# Patient Record
Sex: Male | Born: 1960 | Race: White | Hispanic: No | Marital: Married | Smoking: Never smoker
Health system: Southern US, Community
[De-identification: ages and names within clinical notes are randomized; demographics above are authoritative.]

## PROBLEM LIST (undated history)

## (undated) DIAGNOSIS — Z87442 Personal history of urinary calculi: Secondary | ICD-10-CM

## (undated) DIAGNOSIS — E663 Overweight: Secondary | ICD-10-CM

## (undated) DIAGNOSIS — M545 Low back pain: Secondary | ICD-10-CM

## (undated) DIAGNOSIS — T7840XA Allergy, unspecified, initial encounter: Secondary | ICD-10-CM

## (undated) DIAGNOSIS — K219 Gastro-esophageal reflux disease without esophagitis: Secondary | ICD-10-CM

## (undated) DIAGNOSIS — E7800 Pure hypercholesterolemia, unspecified: Secondary | ICD-10-CM

## (undated) DIAGNOSIS — J309 Allergic rhinitis, unspecified: Secondary | ICD-10-CM

## (undated) HISTORY — PX: OTHER SURGICAL HISTORY: SHX169

## (undated) HISTORY — DX: Pure hypercholesterolemia, unspecified: E78.00

## (undated) HISTORY — DX: Allergic rhinitis, unspecified: J30.9

## (undated) HISTORY — DX: Gastro-esophageal reflux disease without esophagitis: K21.9

## (undated) HISTORY — DX: Overweight: E66.3

## (undated) HISTORY — PX: TONSILLECTOMY: SUR1361

## (undated) HISTORY — DX: Low back pain: M54.5

## (undated) HISTORY — PX: COLONOSCOPY: SHX174

## (undated) HISTORY — DX: Allergy, unspecified, initial encounter: T78.40XA

## (undated) HISTORY — DX: Personal history of urinary calculi: Z87.442

---

## 1999-12-08 ENCOUNTER — Emergency Department (HOSPITAL_COMMUNITY): Admission: EM | Admit: 1999-12-08 | Discharge: 1999-12-08 | Payer: Self-pay

## 2001-10-09 ENCOUNTER — Ambulatory Visit (HOSPITAL_COMMUNITY): Admission: RE | Admit: 2001-10-09 | Discharge: 2001-10-09 | Payer: Self-pay | Admitting: Gastroenterology

## 2003-09-19 ENCOUNTER — Ambulatory Visit (HOSPITAL_COMMUNITY): Admission: RE | Admit: 2003-09-19 | Discharge: 2003-09-19 | Payer: Self-pay | Admitting: Urology

## 2003-10-20 ENCOUNTER — Ambulatory Visit (HOSPITAL_BASED_OUTPATIENT_CLINIC_OR_DEPARTMENT_OTHER): Admission: RE | Admit: 2003-10-20 | Discharge: 2003-10-20 | Payer: Self-pay | Admitting: Urology

## 2004-01-03 ENCOUNTER — Ambulatory Visit: Payer: Self-pay | Admitting: Internal Medicine

## 2004-03-19 ENCOUNTER — Ambulatory Visit: Payer: Self-pay | Admitting: Internal Medicine

## 2004-06-11 ENCOUNTER — Ambulatory Visit: Payer: Self-pay | Admitting: Internal Medicine

## 2004-07-02 ENCOUNTER — Ambulatory Visit: Payer: Self-pay | Admitting: Internal Medicine

## 2005-04-09 ENCOUNTER — Ambulatory Visit: Payer: Self-pay | Admitting: Internal Medicine

## 2005-04-12 ENCOUNTER — Ambulatory Visit: Payer: Self-pay | Admitting: Internal Medicine

## 2005-07-04 ENCOUNTER — Ambulatory Visit: Payer: Self-pay | Admitting: Internal Medicine

## 2005-07-10 ENCOUNTER — Ambulatory Visit: Payer: Self-pay | Admitting: Internal Medicine

## 2006-07-22 ENCOUNTER — Ambulatory Visit: Payer: Self-pay | Admitting: Internal Medicine

## 2006-07-22 LAB — CONVERTED CEMR LAB
ALT: 28 U/L
AST: 20 U/L
Albumin: 4 g/dL
Alkaline Phosphatase: 54 U/L
BUN: 24 mg/dL — ABNORMAL HIGH
Basophils Absolute: 0 K/uL
Basophils Relative: 1 %
Bilirubin Urine: NEGATIVE
Bilirubin, Direct: 0.1 mg/dL
CO2: 31 meq/L
Calcium: 9.2 mg/dL
Chloride: 105 meq/L
Cholesterol: 212 mg/dL
Creatinine, Ser: 1.1 mg/dL
Direct LDL: 135.2 mg/dL
Eosinophils Absolute: 0.2 K/uL
Eosinophils Relative: 4.5 %
GFR calc Af Amer: 92 mL/min
GFR calc non Af Amer: 76 mL/min
Glucose, Bld: 94 mg/dL
HCT: 38.5 % — ABNORMAL LOW
HDL: 30.7 mg/dL — ABNORMAL LOW
Hemoglobin, Urine: NEGATIVE
Hemoglobin: 13.3 g/dL
Ketones, ur: NEGATIVE mg/dL
Leukocytes, UA: NEGATIVE
Lymphocytes Relative: 30.9 %
MCHC: 34.5 g/dL
MCV: 88.7 fL
Monocytes Absolute: 0.3 K/uL
Monocytes Relative: 8.9 %
Neutro Abs: 2 K/uL
Neutrophils Relative %: 54.7 %
Nitrite: NEGATIVE
PSA: 1.59 ng/mL
Platelets: 145 K/uL — ABNORMAL LOW
Potassium: 4.3 meq/L
RBC: 4.34 M/uL
RDW: 12.1 %
Sodium: 140 meq/L
Specific Gravity, Urine: 1.025
TSH: 1.07 u[IU]/mL
Total Bilirubin: 0.9 mg/dL
Total CHOL/HDL Ratio: 6.9
Total Protein, Urine: NEGATIVE mg/dL
Total Protein: 6.7 g/dL
Triglycerides: 114 mg/dL
Urine Glucose: NEGATIVE mg/dL
Urobilinogen, UA: 0.2
VLDL: 23 mg/dL
WBC: 3.6 10*3/microliter — ABNORMAL LOW
pH: 6

## 2006-07-30 ENCOUNTER — Ambulatory Visit: Payer: Self-pay | Admitting: Internal Medicine

## 2006-11-26 ENCOUNTER — Encounter: Payer: Self-pay | Admitting: *Deleted

## 2006-11-26 DIAGNOSIS — T7840XA Allergy, unspecified, initial encounter: Secondary | ICD-10-CM

## 2006-11-26 DIAGNOSIS — M545 Low back pain, unspecified: Secondary | ICD-10-CM

## 2006-11-26 DIAGNOSIS — E7800 Pure hypercholesterolemia, unspecified: Secondary | ICD-10-CM

## 2006-11-26 DIAGNOSIS — E663 Overweight: Secondary | ICD-10-CM | POA: Insufficient documentation

## 2006-11-26 HISTORY — DX: Allergy, unspecified, initial encounter: T78.40XA

## 2006-11-26 HISTORY — DX: Overweight: E66.3

## 2006-11-26 HISTORY — DX: Low back pain, unspecified: M54.50

## 2006-11-26 HISTORY — DX: Pure hypercholesterolemia, unspecified: E78.00

## 2006-12-31 ENCOUNTER — Encounter: Payer: Self-pay | Admitting: Internal Medicine

## 2007-09-16 ENCOUNTER — Ambulatory Visit: Payer: Self-pay | Admitting: Internal Medicine

## 2007-09-16 LAB — CONVERTED CEMR LAB
ALT: 22 units/L (ref 0–53)
AST: 15 units/L (ref 0–37)
BUN: 24 mg/dL — ABNORMAL HIGH (ref 6–23)
Bilirubin Urine: NEGATIVE
Chloride: 104 meq/L (ref 96–112)
Eosinophils Relative: 2.2 % (ref 0.0–5.0)
Glucose, Bld: 91 mg/dL (ref 70–99)
HCT: 45.6 % (ref 39.0–52.0)
HDL: 30.9 mg/dL — ABNORMAL LOW (ref >39.0)
Hemoglobin, Urine: NEGATIVE
Ketones, ur: NEGATIVE mg/dL
Leukocytes, UA: NEGATIVE
Lymphocytes Relative: 27.2 % (ref 12.0–46.0)
MCV: 89.9 fL (ref 78.0–100.0)
Neutro Abs: 2.8 10*3/uL (ref 1.4–7.7)
Neutrophils Relative %: 61.5 % (ref 43.0–77.0)
Nitrite: NEGATIVE
Platelets: 147 10*3/uL — ABNORMAL LOW (ref 150–400)
Potassium: 3.9 meq/L (ref 3.5–5.1)
Sodium: 140 meq/L (ref 135–145)
TSH: 1.71 microintl units/mL (ref 0.35–5.50)
Total CHOL/HDL Ratio: 6
Total Protein, Urine: NEGATIVE mg/dL

## 2007-09-21 ENCOUNTER — Ambulatory Visit: Payer: Self-pay | Admitting: Internal Medicine

## 2007-09-21 DIAGNOSIS — K219 Gastro-esophageal reflux disease without esophagitis: Secondary | ICD-10-CM

## 2007-09-21 DIAGNOSIS — Z87442 Personal history of urinary calculi: Secondary | ICD-10-CM | POA: Insufficient documentation

## 2007-09-21 DIAGNOSIS — J309 Allergic rhinitis, unspecified: Secondary | ICD-10-CM | POA: Insufficient documentation

## 2007-09-21 HISTORY — DX: Gastro-esophageal reflux disease without esophagitis: K21.9

## 2007-09-21 HISTORY — DX: Personal history of urinary calculi: Z87.442

## 2007-09-21 HISTORY — DX: Allergic rhinitis, unspecified: J30.9

## 2008-10-12 ENCOUNTER — Ambulatory Visit: Payer: Self-pay | Admitting: Internal Medicine

## 2008-10-12 LAB — CONVERTED CEMR LAB
ALT: 23 units/L (ref 0–53)
Basophils Relative: 0.5 % (ref 0.0–3.0)
CO2: 33 meq/L — ABNORMAL HIGH (ref 19–32)
Calcium: 9.3 mg/dL (ref 8.4–10.5)
Chloride: 102 meq/L (ref 96–112)
Cholesterol: 204 mg/dL — ABNORMAL HIGH (ref 0–200)
Eosinophils Absolute: 0.2 10*3/uL (ref 0.0–0.7)
Eosinophils Relative: 3.1 % (ref 0.0–5.0)
HCT: 48.8 % (ref 39.0–52.0)
HDL: 32.7 mg/dL — ABNORMAL LOW (ref >39.00)
Hemoglobin: 16.9 g/dL (ref 13.0–17.0)
Lymphocytes Relative: 28.6 % (ref 12.0–46.0)
MCHC: 34.6 g/dL (ref 30.0–36.0)
MCV: 90.9 fL (ref 78.0–100.0)
Nitrite: NEGATIVE
PSA: 1.88 ng/mL (ref 0.10–4.00)
Platelets: 150 10*3/uL (ref 150.0–400.0)
Potassium: 4.5 meq/L (ref 3.5–5.1)
RBC: 5.37 M/uL (ref 4.22–5.81)
TSH: 2.3 microintl units/mL (ref 0.35–5.50)
Total CHOL/HDL Ratio: 6
Total Protein, Urine: NEGATIVE mg/dL
Total Protein: 7.1 g/dL (ref 6.0–8.3)
Triglycerides: 142 mg/dL (ref 0.0–149.0)
Urobilinogen, UA: 0.2 (ref 0.0–1.0)
VLDL: 28.4 mg/dL (ref 0.0–40.0)
pH: 7 (ref 5.0–8.0)

## 2008-10-17 ENCOUNTER — Ambulatory Visit: Payer: Self-pay | Admitting: Internal Medicine

## 2009-10-12 ENCOUNTER — Ambulatory Visit: Payer: Self-pay | Admitting: Internal Medicine

## 2009-10-12 LAB — CONVERTED CEMR LAB
ALT: 26 units/L (ref 0–53)
AST: 19 units/L (ref 0–37)
Alkaline Phosphatase: 50 units/L (ref 39–117)
BUN: 21 mg/dL (ref 6–23)
Bilirubin Urine: NEGATIVE
Bilirubin, Direct: 0.2 mg/dL (ref 0.0–0.3)
CO2: 30 meq/L (ref 19–32)
Calcium: 9.2 mg/dL (ref 8.4–10.5)
Chloride: 105 meq/L (ref 96–112)
Creatinine, Ser: 1.1 mg/dL (ref 0.4–1.5)
Direct LDL: 150.3 mg/dL
Eosinophils Relative: 1.7 % (ref 0.0–5.0)
Glucose, Bld: 80 mg/dL (ref 70–99)
HDL: 34.9 mg/dL — ABNORMAL LOW (ref >39.00)
Hemoglobin, Urine: NEGATIVE
Leukocytes, UA: NEGATIVE
Lymphs Abs: 1.3 10*3/uL (ref 0.7–4.0)
MCHC: 34.8 g/dL (ref 30.0–36.0)
Monocytes Absolute: 0.4 10*3/uL (ref 0.1–1.0)
Neutro Abs: 3 10*3/uL (ref 1.4–7.7)
Neutrophils Relative %: 61.7 % (ref 43.0–77.0)
PSA: 1.68 ng/mL (ref 0.10–4.00)
Platelets: 159 10*3/uL (ref 150.0–400.0)
Potassium: 4.5 meq/L (ref 3.5–5.1)
RDW: 13 % (ref 11.5–14.6)
Sodium: 142 meq/L (ref 135–145)
Specific Gravity, Urine: 1.025 (ref 1.000–1.030)
Total Bilirubin: 1 mg/dL (ref 0.3–1.2)
Urobilinogen, UA: 0.2 (ref 0.0–1.0)
pH: 6.5 (ref 5.0–8.0)

## 2009-10-18 ENCOUNTER — Ambulatory Visit: Payer: Self-pay | Admitting: Internal Medicine

## 2009-10-18 ENCOUNTER — Encounter: Payer: Self-pay | Admitting: Internal Medicine

## 2009-11-28 ENCOUNTER — Ambulatory Visit: Payer: Self-pay | Admitting: Internal Medicine

## 2009-11-28 LAB — CONVERTED CEMR LAB
ALT: 27 units/L (ref 0–53)
AST: 17 units/L (ref 0–37)
LDL Cholesterol: 106 mg/dL — ABNORMAL HIGH (ref 0–99)
Total Bilirubin: 1 mg/dL (ref 0.3–1.2)
Total Protein: 6.5 g/dL (ref 6.0–8.3)
VLDL: 21 mg/dL (ref 0.0–40.0)

## 2010-03-18 LAB — CONVERTED CEMR LAB: PSA: 1.68 ng/mL (ref 0.10–4.00)

## 2010-03-20 NOTE — Assessment & Plan Note (Signed)
Summary: CPX    Vital Signs:  Patient profile:   50 year old male Height:      69.5 inches Weight:      190 pounds BMI:     27.76 O2 Sat:      97 % on Room air Temp:     97.3 degrees F oral Pulse rate:   75 / minute BP sitting:   110 / 80  (left arm) Cuff size:   regular  Vitals Entered By: Zella Ball Ewing CMA Duncan Dull) (October 18, 2009 8:27 AM)  O2 Flow:  Room air  CC: Adult Physical/RE   CC:  Adult Physical/RE.  History of Present Illness: unfort gained 10 lbs with dietary indiscretion relate dto work but plans to do better; trying to folow lower chol diet;  Pt denies CP, worsening sob, doe, wheezing, orthopnea, pnd, worsening LE edema, palps, dizziness or syncope  Pt denies new neuro symptoms such as headache, facial or extremity weakness  No fever, wt loss, night sweats, loss of appetite or other constitutional symptoms  Overall good complaince with meds, and good tolerability.    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 1 By Mouth Once Daily 2)  Adult Aspirin Ec Low Strength 81 Mg  Tbec (Aspirin) .[name].. 1 By Mouth Once Daily  Current Medications (verified): 1)  Pravachol 40 Mg  Tabs (Pravastatin Sodium) .[name].. 1 By Mouth Once Daily 2)  Adult Aspirin Ec Low Strength 81 Mg  Tbec (Aspirin) .[name].. 1 By Mouth Once Daily  Allergies (verified): No Known Drug Allergies  Past History:  Past Medical History: Last updated: 09/21/2007 BACK PAIN, LUMBAR, CHRONIC (ICD-724.2) s/p ESI ALLERGY (ICD-995.3) OBESITY,  MILD (ICD-278.02) HYPERCHOLESTEROLEMIA (ICD-272.0) Nephrolithiasis, hx of  Allergic rhinitis GERD E.D.  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         all otherwise negative per pt -    Physical Exam  General:  alert and overweight-appearing.   - mild Head:  normocephalic and atraumatic.   Eyes:  vision grossly intact, pupils equal, and pupils round.   Ears:  R ear normal and L ear normal.   Nose:  no  external deformity and no nasal discharge.   Mouth:  no gingival abnormalities and pharynx pink and moist.   Neck:  supple and no masses.   Lungs:  normal respiratory effort and normal breath sounds.   Heart:  normal rate and regular rhythm.   Abdomen:  soft, non-tender, and normal bowel sounds.   Msk:  no joint tenderness and no joint swelling.   Extremities:  no edema, no erythema  Neurologic:  cranial nerves II-XII intact and  strength normal in all extremities.     Impression & Recommendations:  Problem # 1:  Preventive Health Care (ICD-V70.0) Overall doing well, age appropriate education and counseling updated and referral for appropriate preventive services done unless declined, immunizations up to date or declined, diet counseling done if overweight, urged to quit smoking if smokes , most recent labs reviewed and current ordered if appropriate, ecg reviewed or declined (interpretation per ECG scanned in the EMR if done); information regarding Medicare Prevention requirements given if appropriate; speciality referrals updated as appropriate   Complete Medication List: 1)  Pravachol 40 Mg Tabs (Pravastatin sodium) .[name].. 1 by mouth once daily 2)  Adult Aspirin Ec Low Strength 81 Mg Tbec (Aspirin) .[name].. 1 by mouth once daily  Patient Instructions: 1)  please re-start the cholesterol medication 2)  please return for LAB only in 4 wks: 3)  Hepatic Panel prior to visit, ICD-9: v85.69 4)  Lipid Panel prior to visit, ICD-9: 272.0 5)  Continue all previous medications as before this visit  6)  Please schedule a follow-up appointment in 1 year or sooner if needed Prescriptions: PRAVACHOL 40 MG  TABS (PRAVASTATIN SODIUM) 1 by mouth once daily  #90 x 3   Entered and Authorized by:   Corwin Levins MD   Signed by:   Corwin Levins MD on 10/18/2009   Method used:   Print then Give to Patient   RxID:   [id number]

## 2010-07-06 NOTE — Op Note (Signed)
NAME:  Tyler Murphy, Tyler Murphy                          ACCOUNT NO.:  [id number]   MEDICAL RECORD NO.:  [id number]                   PATIENT TYPE:  AMB   LOCATION:  [facility]                                 FACILITY:  [facility]   PHYSICIAN:  Excell Seltzer. Annabell Howells, M.D.                 DATE OF BIRTH:  [birth year]   DATE OF PROCEDURE:  10/20/2003  DATE OF DISCHARGE:                                 OPERATIVE REPORT   PROCEDURE:  Left ureteroscopic stone extraction and insertion of a left  double-J stent.   POSTOPERATIVE DIAGNOSIS:  Left distal ureteral stone.   POSTOPERATIVE DIAGNOSIS:  Left distal ureteral stone.   SURGEON:  Excell Seltzer. Annabell Howells, M.D.   ANESTHESIA:  General.   SPECIMENS:  Stone.   DRAINS:  A 6 French x 26 cm double-J stent.   COMPLICATIONS:  None.   INDICATIONS:  Mr. Abril is a 50 year old white male who recently underwent  lithotripsy for a left distal ureteral stone.  The stone did not appear to  be impacted by lithotripsy, and he now returns for ureteroscopic stone  extraction.   FINDINGS/PROCEDURE:  Patient was given p.o. antibiotics.  He was taken to  the operating room where a general anesthetic was induced.  He was placed in  the lithotomy position.  His perineum and genitalia were prepped with  Betadine solution.  He was draped in the usual sterile fashion.  A 6 French  short ureteroscope was passed per urethra.  This revealed a normal urethra,  a short, nonobstructing prostate, and an unremarkable bladder.  The left  ureteral orifice was cannulated with a  guidewire.  The stone was noted to  move with the passage of the wire; however, I was unable to get the  ureteroscope over the wire and up the ureteral orifice.  The ureteroscope  was removed, and a 4 cm 15 French balloon dilation catheter was passed over  the wire, up the ureteral meatus where the balloon was dilated.  There was a  waist at the level of the meatus which dissipated with 14 atmospheres of  pressure.  The balloon  was then removed.  The ureteroscope was then inserted  alongside the wire.  The stone was visualized and grasped with a Nitinol  basket and removed without difficulty.  Once the stone was removed, a 6  French 26 cm double-J stent was inserted over the wire to the kidney and  fluoroscopic guidance.  Once in position, the wire was removed, leaving good  coil in the kidney and good coil in the bladder.  The stent was taped to his  penis.  He was taken down from the lithotomy position.  Anesthetic was  reversed.  He was moved to the recovery room in stable condition.  There  were no complications.  Excell Seltzer. Annabell Howells, M.D.   JJW/MEDQ  D:  10/20/2003  T:  10/20/2003  Job:  [id number]

## 2010-10-25 ENCOUNTER — Telehealth: Payer: Self-pay

## 2010-10-25 DIAGNOSIS — Z0389 Encounter for observation for other suspected diseases and conditions ruled out: Secondary | ICD-10-CM

## 2010-10-25 DIAGNOSIS — Z0000 Encounter for general adult medical examination without abnormal findings: Secondary | ICD-10-CM

## 2010-10-25 NOTE — Telephone Encounter (Signed)
Put order in for lab work,

## 2010-10-26 ENCOUNTER — Other Ambulatory Visit (INDEPENDENT_AMBULATORY_CARE_PROVIDER_SITE_OTHER): Payer: BC Managed Care – PPO

## 2010-10-26 ENCOUNTER — Encounter: Payer: Self-pay | Admitting: Internal Medicine

## 2010-10-26 ENCOUNTER — Other Ambulatory Visit: Payer: Self-pay

## 2010-10-26 DIAGNOSIS — Z0000 Encounter for general adult medical examination without abnormal findings: Secondary | ICD-10-CM | POA: Insufficient documentation

## 2010-10-26 DIAGNOSIS — Z0389 Encounter for observation for other suspected diseases and conditions ruled out: Secondary | ICD-10-CM

## 2010-10-26 DIAGNOSIS — Z0001 Encounter for general adult medical examination with abnormal findings: Secondary | ICD-10-CM | POA: Insufficient documentation

## 2010-10-26 LAB — HEPATIC FUNCTION PANEL
AST: 18 U/L (ref 0–37)
Bilirubin, Direct: 0.1 mg/dL (ref 0.0–0.3)
Total Protein: 6.6 g/dL (ref 6.0–8.3)

## 2010-10-26 LAB — CBC WITH DIFFERENTIAL/PLATELET
Basophils Relative: 0.5 % (ref 0.0–3.0)
Eosinophils Relative: 2 % (ref 0.0–5.0)
Lymphocytes Relative: 31.9 % (ref 12.0–46.0)
Lymphs Abs: 1.5 10*3/uL (ref 0.7–4.0)
MCHC: 34.1 g/dL (ref 30.0–36.0)
MCV: 90.3 fl (ref 78.0–100.0)
Monocytes Absolute: 0.4 10*3/uL (ref 0.1–1.0)
Neutro Abs: 2.8 10*3/uL (ref 1.4–7.7)
Neutrophils Relative %: 57.4 % (ref 43.0–77.0)
Platelets: 165 10*3/uL (ref 150.0–400.0)
RDW: 13.1 % (ref 11.5–14.6)
WBC: 4.8 10*3/uL (ref 4.5–10.5)

## 2010-10-26 LAB — URINALYSIS, ROUTINE W REFLEX MICROSCOPIC
Ketones, ur: NEGATIVE
Nitrite: NEGATIVE
Specific Gravity, Urine: 1.025 (ref 1.000–1.030)
Urine Glucose: NEGATIVE
pH: 5.5 (ref 5.0–8.0)

## 2010-10-26 LAB — LIPID PANEL
Cholesterol: 199 mg/dL (ref 0–200)
LDL Cholesterol: 146 mg/dL — ABNORMAL HIGH (ref 0–99)
Total CHOL/HDL Ratio: 5
Triglycerides: 74 mg/dL (ref 0.0–149.0)

## 2010-10-26 LAB — BASIC METABOLIC PANEL
CO2: 27 mEq/L (ref 19–32)
Calcium: 9.1 mg/dL (ref 8.4–10.5)

## 2010-10-26 LAB — TSH: TSH: 1.84 u[IU]/mL (ref 0.35–5.50)

## 2010-11-01 ENCOUNTER — Encounter: Payer: Self-pay | Admitting: Internal Medicine

## 2010-11-01 ENCOUNTER — Ambulatory Visit (INDEPENDENT_AMBULATORY_CARE_PROVIDER_SITE_OTHER): Payer: BC Managed Care – PPO | Admitting: Internal Medicine

## 2010-11-01 VITALS — BP 112/78 | HR 83 | Temp 98.6°F | Ht 70.0 in | Wt 186.0 lb

## 2010-11-01 DIAGNOSIS — E7800 Pure hypercholesterolemia, unspecified: Secondary | ICD-10-CM

## 2010-11-01 DIAGNOSIS — Z79899 Other long term (current) drug therapy: Secondary | ICD-10-CM

## 2010-11-01 DIAGNOSIS — Z0000 Encounter for general adult medical examination without abnormal findings: Secondary | ICD-10-CM

## 2010-11-01 DIAGNOSIS — Z23 Encounter for immunization: Secondary | ICD-10-CM

## 2010-11-01 MED ORDER — ATORVASTATIN CALCIUM 20 MG PO TABS: 20.0000 mg | ORAL_TABLET | Freq: Every day | ORAL | Status: DC

## 2010-11-01 NOTE — Patient Instructions (Signed)
Please re-start the Aspirin 81 mg at one per day Start the lipitor generic at 20 mg per day Please return in 4 wks for followup lab tests :  Lipids, and liver tests Please call the phone number [phone] (the PhoneTree System) for results of testing in 2-3 days;  When calling, simply dial the number, and when prompted enter the MRN number above (the Medical Record Number) and the # key, then the message should start. You had the flu shot today Please return in 1 year for your yearly visit, or sooner if needed, with Lab testing done 3-5 days before

## 2010-11-01 NOTE — Assessment & Plan Note (Signed)
For f/u lft on new statin in 4 wks

## 2010-11-01 NOTE — Assessment & Plan Note (Signed)
Uncontrolled, for lipitor 20 , f/u lab in 4 wks,  Lab Results  Component Value Date   LDLCALC 146* 10/26/2010

## 2010-11-01 NOTE — Progress Notes (Signed)
Subjective:    Patient ID: Tyler Murphy, male    DOB: December 08, 1960, 50 y.o.   MRN: [id number]  HPI Here for wellness and f/u;  Overall doing ok;  Pt denies CP, worsening SOB, DOE, wheezing, orthopnea, PND, worsening LE edema, palpitations, dizziness or syncope.  Pt denies neurological change such as new Headache, facial or extremity weakness.  Pt denies polydipsia, polyuria, or low sugar symptoms. Pt states overall good compliance with treatment and medications, good tolerability, and trying to follow lower cholesterol diet.  Pt denies worsening depressive symptoms, suicidal ideation or panic. No fever, wt loss, night sweats, loss of appetite, or other constitutional symptoms.  Pt states good ability with ADL's, low fall risk, home safety reviewed and adequate, no significant changes in hearing or vision, and occasionally active with exercise. No acute complaints Past Medical History  Diagnosis Date  . ALLERGIC RHINITIS 09/21/2007  . ALLERGY 11/26/2006  . BACK PAIN, LUMBAR, CHRONIC 11/26/2006  . GERD 09/21/2007  . HYPERCHOLESTEROLEMIA 11/26/2006  . 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 OBESITY, MILD 11/26/2006   Past Surgical History  Procedure Date  . Tonsillectomy   . S/p lithotrypsy     reports that he has never smoked. He does not have any smokeless tobacco history on file. He reports that he does not drink alcohol. His drug history not on file. family history is not on file.  He is adopted. No Known Allergies Current Outpatient Prescriptions on File Prior to Visit  Medication Sig Dispense Refill  . aspirin 81 MG tablet Take 81 mg by mouth daily.         Review of Systems Review of Systems  Constitutional: Negative for diaphoresis, activity change, appetite change and unexpected weight change.  HENT: Negative for hearing loss, ear pain, facial swelling, mouth sores and neck stiffness.   Eyes: Negative for pain, redness and visual disturbance.  Respiratory: Negative for shortness of breath  and wheezing.   Cardiovascular: Negative for chest pain and palpitations.  Gastrointestinal: Negative for diarrhea, blood in stool, abdominal distention and rectal pain.  Genitourinary: Negative for hematuria, flank pain and decreased urine volume.  Musculoskeletal: Negative for myalgias and joint swelling.  Skin: Negative for color change and wound.  Neurological: Negative for syncope and numbness.  Hematological: Negative for adenopathy.  Psychiatric/Behavioral: Negative for hallucinations, self-injury, decreased concentration and agitation.      Objective:   Physical Exam BP 112/78  Pulse 83  Temp(Src) 98.6 F (37 C) (Oral)  Ht 5\' 10"  (1.778 m)  Wt 186 lb (84.369 kg)  BMI 26.69 kg/m2  SpO2 96% Physical Exam  VS noted Constitutional: Pt is oriented to person, place, and time. Appears well-developed and well-nourished.  HENT:  Head: Normocephalic and atraumatic.  Right Ear: External ear normal.  Left Ear: External ear normal.  Nose: Nose normal.  Mouth/Throat: Oropharynx is clear and moist.  Eyes: Conjunctivae and EOM are normal. Pupils are equal, round, and reactive to light.  Neck: Normal range of motion. Neck supple. No JVD present. No tracheal deviation present.  Cardiovascular: Normal rate, regular rhythm, normal heart sounds and intact distal pulses.   Pulmonary/Chest: Effort normal and breath sounds normal.  Abdominal: Soft. Bowel sounds are normal. There is no tenderness.  Musculoskeletal: Normal range of motion. Exhibits no edema.  Lymphadenopathy:  Has no cervical adenopathy.  Neurological: Pt is alert and oriented to person, place, and time. Pt has normal reflexes. No cranial nerve deficit.  Skin: Skin is warm and dry. No  rash noted.  Psychiatric:  Has  normal mood and affect. Behavior is normal.         Assessment & Plan:

## 2010-11-30 ENCOUNTER — Other Ambulatory Visit (INDEPENDENT_AMBULATORY_CARE_PROVIDER_SITE_OTHER): Payer: BC Managed Care – PPO

## 2010-11-30 DIAGNOSIS — Z79899 Other long term (current) drug therapy: Secondary | ICD-10-CM

## 2010-11-30 DIAGNOSIS — E7800 Pure hypercholesterolemia, unspecified: Secondary | ICD-10-CM

## 2010-11-30 LAB — LIPID PANEL
Cholesterol: 118 mg/dL (ref 0–200)
HDL: 39.4 mg/dL (ref >39.00)
LDL Cholesterol: 67 mg/dL (ref 0–99)
Total CHOL/HDL Ratio: 3
Triglycerides: 57 mg/dL (ref 0.0–149.0)
VLDL: 11.4 mg/dL (ref 0.0–40.0)

## 2010-11-30 LAB — HEPATIC FUNCTION PANEL
AST: 19 U/L (ref 0–37)
Total Bilirubin: 0.8 mg/dL (ref 0.3–1.2)

## 2010-12-01 NOTE — Progress Notes (Signed)
Quick Note:  Voice message left on PhoneTree system - lab is negative, normal or otherwise stable, pt to continue same tx ______ 

## 2011-10-26 ENCOUNTER — Ambulatory Visit (INDEPENDENT_AMBULATORY_CARE_PROVIDER_SITE_OTHER): Payer: BC Managed Care – PPO | Admitting: Family Medicine

## 2011-10-26 ENCOUNTER — Encounter: Payer: Self-pay | Admitting: Family Medicine

## 2011-10-26 VITALS — BP 116/80 | HR 65 | Temp 97.6°F | Resp 16 | Ht 70.0 in | Wt 186.0 lb

## 2011-10-26 DIAGNOSIS — M79609 Pain in unspecified limb: Secondary | ICD-10-CM

## 2011-10-26 DIAGNOSIS — S61209A Unspecified open wound of unspecified finger without damage to nail, initial encounter: Secondary | ICD-10-CM

## 2011-10-26 NOTE — Patient Instructions (Signed)
WOUND CARE Please return in 10 days to have your stitches/staples removed or sooner if you have concerns. [name] Keep area clean and dry for 24 hours. Do not remove bandage, if applied. . After 24 hours, remove bandage and wash wound gently with mild soap and warm water. Reapply a new bandage after cleaning wound, if directed. . Continue daily cleansing with soap and water until stitches/staples are removed. . Do not apply any ointments or creams to the wound while stitches/staples are in place, as this may cause delayed healing. . Notify the office if you experience any of the following signs of infection: Swelling, redness, pus drainage, streaking, fever >101.0 F . Notify the office if you experience excessive bleeding that does not stop after 15-20 minutes of constant, firm Pressure.   Tetanus vaccine given today.  Recommend a TDAP in about 1- 2 years which will update the Pertussis also.

## 2011-10-26 NOTE — Progress Notes (Signed)
Subjective: Patient was doing some trimming this morning using clippers rather than making nor use with something powered like his trimmers. He cut his left index finger. He came on over here.  Objective: Last tetanus shot was in 05. Wound on the radial aspect of left index finger less than 2 cm long. Sensory is grossly intact. Flexion and extension is firm.  Assessment: Wound index finger  Plan: Tetanus Sutures

## 2011-10-26 NOTE — Progress Notes (Signed)
Verbal consent obtained from the patient.  Local anesthesia with 4cc Lidocaine 2% without epinephrine.  Wound scrubbed with soap and water and rinsed.  Wound closed with #5 5-0 Prolene simple interrupted sutures.  Wound cleansed and dressed.  

## 2011-11-01 ENCOUNTER — Other Ambulatory Visit (INDEPENDENT_AMBULATORY_CARE_PROVIDER_SITE_OTHER): Payer: BC Managed Care – PPO

## 2011-11-01 DIAGNOSIS — Z0000 Encounter for general adult medical examination without abnormal findings: Secondary | ICD-10-CM

## 2011-11-01 LAB — URINALYSIS, ROUTINE W REFLEX MICROSCOPIC
Leukocytes, UA: NEGATIVE
Specific Gravity, Urine: 1.025 (ref 1.000–1.030)
Total Protein, Urine: NEGATIVE
Urine Glucose: NEGATIVE

## 2011-11-01 LAB — CBC WITH DIFFERENTIAL/PLATELET
Basophils Absolute: 0 10*3/uL (ref 0.0–0.1)
Basophils Relative: 0.4 % (ref 0.0–3.0)
Eosinophils Absolute: 0.1 10*3/uL (ref 0.0–0.7)
Lymphocytes Relative: 25.5 % (ref 12.0–46.0)
MCHC: 34.3 g/dL (ref 30.0–36.0)
Neutrophils Relative %: 65.2 % (ref 43.0–77.0)
RBC: 5.45 Mil/uL (ref 4.22–5.81)
RDW: 12.8 % (ref 11.5–14.6)

## 2011-11-01 LAB — BASIC METABOLIC PANEL
Calcium: 9.3 mg/dL (ref 8.4–10.5)
GFR: 74.61 mL/min (ref >60.00)
Sodium: 137 mEq/L (ref 135–145)

## 2011-11-01 LAB — LIPID PANEL
HDL: 34.9 mg/dL — ABNORMAL LOW (ref >39.00)
LDL Cholesterol: 141 mg/dL — ABNORMAL HIGH (ref 0–99)
Total CHOL/HDL Ratio: 6
VLDL: 23.6 mg/dL (ref 0.0–40.0)

## 2011-11-01 LAB — HEPATIC FUNCTION PANEL
Alkaline Phosphatase: 56 U/L (ref 39–117)
Bilirubin, Direct: 0.1 mg/dL (ref 0.0–0.3)
Total Bilirubin: 1 mg/dL (ref 0.3–1.2)

## 2011-11-01 LAB — PSA: PSA: 2.39 ng/mL (ref 0.10–4.00)

## 2011-11-04 ENCOUNTER — Ambulatory Visit (INDEPENDENT_AMBULATORY_CARE_PROVIDER_SITE_OTHER): Payer: BC Managed Care – PPO | Admitting: Family Medicine

## 2011-11-04 VITALS — BP 105/65 | HR 73 | Temp 97.8°F | Resp 16 | Ht 69.5 in | Wt 189.0 lb

## 2011-11-04 DIAGNOSIS — S61209A Unspecified open wound of unspecified finger without damage to nail, initial encounter: Secondary | ICD-10-CM

## 2011-11-04 NOTE — Progress Notes (Signed)
@  [email or web address]@  Patient ID: Tyler Murphy MRN: [id number], DOB: 1960/03/06 51 y.o. Date of Encounter: 11/04/2011, 7:01 PM  Primary Physician: Oliver Barre, MD  Chief Complaint: Suture removal    See note from last week  HPI: 51 y.o. y/o male with injury to index finger Here for suture removal s/p placement on Sept 7 Doing well No issues/complaints Afebrile/ No chills No erythema No pain Able to move without difficulty Normal sensation  Past Medical History  Diagnosis Date  . ALLERGIC RHINITIS 09/21/2007  . ALLERGY 11/26/2006  . BACK PAIN, LUMBAR, CHRONIC 11/26/2006  . GERD 09/21/2007  . HYPERCHOLESTEROLEMIA 11/26/2006  . 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 OBESITY, MILD 11/26/2006     Home Meds: Prior to Admission medications   Medication Sig Start Date End Date Taking? Authorizing Provider  aspirin 81 MG tablet Take 81 mg by mouth daily.     Yes Historical Provider, MD  atorvastatin (LIPITOR) 20 MG tablet Take 1 tablet (20 mg total) by mouth daily. 11/01/10 11/01/11  Corwin Levins, MD    Allergies: No Known Allergies  Physical Exam: Blood pressure 105/65, pulse 73, temperature 97.8 F (36.6 C), temperature source Oral, resp. rate 16, height 5' 9.5" (1.765 m), weight 189 lb (85.73 kg), SpO2 100.00%., Body mass index is 27.51 kg/(m^2). General: Well developed, well nourished, in no acute distress. Head: Normocephalic, atraumatic, sclera non-icteric, no xanthomas, nares are without discharge.  Neck: Supple. Lungs: Breathing is unlabored. Heart: Normal rate. Msk:  Strength and tone appear normal for age. Wound:  Wound well health without erythema, swelling, or tenderness to palpation. FROM and 5/5 strength with normal sensation throughout including 2 point discrimination Skin: See above, otherwise dry without rash or erythema. Extremities: No clubbing or cyanosis. No edema. Neuro: Alert and oriented X 3. Moves all extremities spontaneously.  Psych:  Responds to questions  appropriately with a normal affect.   PROCEDURE: Verbal consent obtained. 5 sutures removed without difficulty.  Assessment and Plan: 51 y.o. y/o male here for suture removal for wound described above. -Sutures removed per above -Wound resolved -RTC prn  Signed, Elvina Sidle, MD 11/04/2011 7:01 PM

## 2011-11-05 ENCOUNTER — Ambulatory Visit (INDEPENDENT_AMBULATORY_CARE_PROVIDER_SITE_OTHER): Payer: BC Managed Care – PPO | Admitting: Internal Medicine

## 2011-11-05 ENCOUNTER — Encounter: Payer: Self-pay | Admitting: Internal Medicine

## 2011-11-05 VITALS — BP 118/80 | HR 86 | Temp 97.4°F | Ht 70.0 in | Wt 189.2 lb

## 2011-11-05 DIAGNOSIS — Z0000 Encounter for general adult medical examination without abnormal findings: Secondary | ICD-10-CM

## 2011-11-05 DIAGNOSIS — Z23 Encounter for immunization: Secondary | ICD-10-CM

## 2011-11-05 MED ORDER — ATORVASTATIN CALCIUM 20 MG PO TABS: 20.0000 mg | ORAL_TABLET | Freq: Every day | ORAL | Status: DC

## 2011-11-05 NOTE — Patient Instructions (Addendum)
You had the flu shot today Please re-start the Aspirin and Lipitor, which was refilled today Please continue your efforts at being more active, low cholesterol diet, and weight control, as you do You are otherwise up to date with prevention measures Please return in 1 year for your yearly visit, or sooner if needed, with Lab testing done 3-5 days before Please remember to sign up for My Chart at your earliest convenience, as this will be important to you in the future with finding out test results.

## 2011-11-05 NOTE — Progress Notes (Signed)
Subjective:    Patient ID: Tyler Murphy, male    DOB: Nov 03, 1960, 51 y.o.   MRN: [id number]  HPI  Here for wellness and f/u;  Overall doing ok;  Pt denies CP, worsening SOB, DOE, wheezing, orthopnea, PND, worsening LE edema, palpitations, dizziness or syncope.  Pt denies neurological change such as new Headache, facial or extremity weakness.  Pt denies polydipsia, polyuria, or low sugar symptoms. Pt states overall good compliance with treatment and medications, good tolerability, and trying to follow lower cholesterol diet.  Pt denies worsening depressive symptoms, suicidal ideation or panic. No fever, wt loss, night sweats, loss of appetite, or other constitutional symptoms.  Pt states good ability with ADL's, low fall risk, home safety reviewed and adequate, no significant changes in hearing or vision, and occasionally active with exercise..    Wt incresed from 186 to 189, no acute complaints.  Not taking the lipitor or asa but willing ot re-start Past Medical History  Diagnosis Date  . ALLERGIC RHINITIS 09/21/2007  . ALLERGY 11/26/2006  . BACK PAIN, LUMBAR, CHRONIC 11/26/2006  . GERD 09/21/2007  . HYPERCHOLESTEROLEMIA 11/26/2006  . 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 OBESITY, MILD 11/26/2006   Past Surgical History  Procedure Date  . Tonsillectomy   . S/p lithotrypsy     reports that he has never smoked. He does not have any smokeless tobacco history on file. He reports that he does not drink alcohol. His drug history not on file. family history is not on file.  He is adopted. No Known Allergies Current Outpatient Prescriptions on File Prior to Visit  Medication Sig Dispense Refill  . aspirin 81 MG tablet Take 81 mg by mouth daily.        [name] atorvastatin (LIPITOR) 20 MG tablet Take 1 tablet (20 mg total) by mouth daily.  90 tablet  3   Review of Systems Review of Systems  Constitutional: Negative for diaphoresis, activity change, appetite change and unexpected weight change.  HENT: Negative  for hearing loss, ear pain, facial swelling, mouth sores and neck stiffness.   Eyes: Negative for pain, redness and visual disturbance.  Respiratory: Negative for shortness of breath and wheezing.   Cardiovascular: Negative for chest pain and palpitations.  Gastrointestinal: Negative for diarrhea, blood in stool, abdominal distention and rectal pain.  Genitourinary: Negative for hematuria, flank pain and decreased urine volume.  Musculoskeletal: Negative for myalgias and joint swelling.  Skin: Negative for color change and wound.  Neurological: Negative for syncope and numbness.  Hematological: Negative for adenopathy.  Psychiatric/Behavioral: Negative for hallucinations, self-injury, decreased concentration and agitation.      Objective:   Physical Exam BP 118/80  Pulse 86  Temp 97.4 F (36.3 C) (Oral)  Ht 5\' 10"  (1.778 m)  Wt 189 lb 4 oz (85.843 kg)  BMI 27.15 kg/m2  SpO2 97% Physical Exam  VS noted Constitutional: Pt is oriented to person, place, and time. Appears well-developed and well-nourished.  HENT:  Head: Normocephalic and atraumatic.  Right Ear: External ear normal.  Left Ear: External ear normal.  Nose: Nose normal.  Mouth/Throat: Oropharynx is clear and moist.  Eyes: Conjunctivae and EOM are normal. Pupils are equal, round, and reactive to light.  Neck: Normal range of motion. Neck supple. No JVD present. No tracheal deviation present.  Cardiovascular: Normal rate, regular rhythm, normal heart sounds and intact distal pulses.   Pulmonary/Chest: Effort normal and breath sounds normal.  Abdominal: Soft. Bowel sounds are normal. There is no tenderness.  Musculoskeletal: Normal range of motion. Exhibits no edema.  Lymphadenopathy:  Has no cervical adenopathy.  Neurological: Pt is alert and oriented to person, place, and time. Pt has normal reflexes. No cranial nerve deficit.  Skin: Skin is warm and dry. No rash noted.  Psychiatric:  Has  normal mood and affect.  Behavior is normal.        Assessment & Plan:

## 2011-11-05 NOTE — Assessment & Plan Note (Signed)

## 2011-11-09 ENCOUNTER — Encounter: Payer: Self-pay | Admitting: Internal Medicine

## 2012-07-26 ENCOUNTER — Ambulatory Visit: Payer: BC Managed Care – PPO

## 2012-07-26 ENCOUNTER — Ambulatory Visit (INDEPENDENT_AMBULATORY_CARE_PROVIDER_SITE_OTHER): Payer: BC Managed Care – PPO | Admitting: Family Medicine

## 2012-07-26 VITALS — BP 116/74 | HR 70 | Temp 98.0°F | Resp 16 | Ht 69.0 in | Wt 188.8 lb

## 2012-07-26 DIAGNOSIS — E663 Overweight: Secondary | ICD-10-CM

## 2012-07-26 DIAGNOSIS — M19019 Primary osteoarthritis, unspecified shoulder: Secondary | ICD-10-CM

## 2012-07-26 DIAGNOSIS — M25512 Pain in left shoulder: Secondary | ICD-10-CM

## 2012-07-26 DIAGNOSIS — M25519 Pain in unspecified shoulder: Secondary | ICD-10-CM

## 2012-07-26 MED ORDER — DICLOFENAC SODIUM 75 MG PO TBEC: 75.0000 mg | DELAYED_RELEASE_TABLET | Freq: Two times a day (BID) | ORAL | Status: DC

## 2012-07-26 MED ORDER — HYDROCODONE-ACETAMINOPHEN 5-325 MG PO TABS: 1.0000 | ORAL_TABLET | Freq: Four times a day (QID) | ORAL | Status: DC | PRN

## 2012-07-26 NOTE — Patient Instructions (Addendum)
Acromioclavicular Injuries The AC (acromioclavicular) joint is the joint in the shoulder where the collarbone (clavicle) meets the shoulder blade (scapula). The part of the shoulder blade connected to the collarbone is called the acromion. Common problems with and treatments for the [facility] joint are detailed below. ARTHRITIS Arthritis occurs when the joint has been injured and the smooth padding between the joints (cartilage) is lost. This is the wear and tear seen in most joints of the body if they have been overused. This causes the joint to produce pain and swelling which is worse with activity.  AC JOINT SEPARATION AC joint separation means that the ligaments connecting the acromion of the shoulder blade and collarbone have been damaged, and the two bones no longer line up. AC separations can be anywhere from mild to severe, and are "graded" depending upon which ligaments are torn and how badly they are torn.  Grade I Injury: the least damage is done, and the [facility] joint still lines up.  Grade II Injury: damage to the ligaments which reinforce the [facility] joint. In a Grade II injury, these ligaments are stretched but not entirely torn. When stressed, the [facility] joint becomes painful and unstable.  Grade III Injury: AC and secondary ligaments are completely torn, and the collarbone is no longer attached to the shoulder blade. This results in deformity; a prominence of the end of the clavicle. AC JOINT FRACTURE AC joint fracture means that there has been a break in the bones of the [facility] joint, usually the end of the clavicle. TREATMENT TREATMENT OF AC ARTHRITIS  There is currently no way to replace the cartilage damaged by arthritis. The best way to improve the condition is to decrease the activities which aggravate the problem. Application of ice to the joint helps decrease pain and soreness (inflammation). The use of non-steroidal anti-inflammatory medication is helpful.  If less conservative measures do not  work, then cortisone shots (injections) may be used. These are anti-inflammatories; they decrease the soreness in the joint and swelling.  If non-surgical measures fail, surgery may be recommended. The procedure is generally removal of a portion of the end of the clavicle. This is the part of the collarbone closest to your acromion which is stabilized with ligaments to the acromion of the shoulder blade. This surgery may be performed using a tube-like instrument with a light (arthroscope) for looking into a joint. It may also be performed as an open surgery through a small incision by the surgeon. Most patients will have good range of motion within 6 weeks and may return to all activity including sports by 8-12 weeks, barring complications. TREATMENT OF AN AC SEPARATION  The initial treatment is to decrease pain. This is best accomplished by immobilizing the arm in a sling and placing an ice pack to the shoulder for 20 to 30 minutes every 2 hours as needed. As the pain starts to subside, it is important to begin moving the fingers, wrist, elbow and eventually the shoulder in order to prevent a stiff or "frozen" shoulder. Instruction on when and how much to move the shoulder will be provided by your caregiver. The length of time needed to regain full motion and function depends on the amount or grade of the injury. Recovery from a Grade I AC separation usually takes 10 to 14 days, whereas a Grade III may take 6 to 8 weeks.  Grade I and II separations usually do not require surgery. Even Grade III injuries usually allow return to full  activity with few restrictions. Treatment is also based on the activity demands of the injured shoulder. For example, a high level quarterback with an injured throwing arm will receive more aggressive treatment than someone with a desk job who rarely uses his/her arm for strenuous activities. In some cases, a painful lump may persist which could require a later surgery. Surgery  can be very successful, but the benefits must be weighed against the potential risks. TREATMENT OF AN AC JOINT FRACTURE Fracture treatment depends on the type of fracture. Sometimes a splint or sling may be all that is required. Other times surgery may be required for repair. This is more frequently the case when the ligaments supporting the clavicle are completely torn. Your caregiver will help you with these decisions and together you can decide what will be the best treatment. HOME CARE INSTRUCTIONS   Apply ice to the injury for 15-20 minutes each hour while awake for 2 days. Put the ice in a plastic bag and place a towel between the bag of ice and skin.  If a sling has been applied, wear it constantly for as long as directed by your caregiver, even at night. The sling or splint can be removed for bathing or showering or as directed. Be sure to keep the shoulder in the same place as when the sling is on. Do not lift the arm.  If a figure-of-eight splint has been applied it should be tightened gently by another person every day. Tighten it enough to keep the shoulders held back. Allow enough room to place the index finger between the body and strap. Loosen the splint immediately if there is numbness or tingling in the hands.  Take over-the-counter or prescription medicines for pain, discomfort or fever as directed by your caregiver.  If you or your child has received a follow up appointment, it is very important to keep that appointment in order to avoid long term complications, chronic pain or disability. SEEK MEDICAL CARE IF:   The pain is not relieved with medications.  There is increased swelling or discoloration that continues to get worse rather than better.  You or your child has been unable to follow up as instructed.  There is progressive numbness and tingling in the arm, forearm or hand. SEEK IMMEDIATE MEDICAL CARE IF:   The arm is numb, cold or pale.  There is increasing pain  in the hand, forearm or fingers. MAKE SURE YOU:   Understand these instructions.  Will watch your condition.  Will get help right away if you are not doing well or get worse. Document Released: 11/14/2004 Document Revised: 04/29/2011 Document Reviewed: 05/09/2008 [facility] Patient Information 2014 [place], [place]. PROGNOSIS   If treated properly, the symptoms of AC separation can be expected to go away.  If treated improperly, permanent disability may occur unless surgery is performed.  Healing time varies with type of sport and position, arm injured (dominant versus non-dominant) and severity of sprain. RELATED COMPLICATIONS  Weakness and fatigue of the arm or shoulder are possible but uncommon.  Pain and inflammation of the [facility] joint may continue.  Prolonged healing time may be necessary if usual activities are resumed too early. This causes a susceptibility to recurrent injury.  Prolonged disability may occur.  The shoulder may remain unstable or arthritic following repeated injury. TREATMENT  Treatment initially involves ice and medication to help reduce pain and inflammation. It may also be necessary to modify your activities in order to prevent further injury. Both  non-surgical and surgical interventions exist to treat AC separation. Non-surgical intervention is usually recommended and involves wearing a sling to immobilize the joint for a period of time to allow for healing. Surgical intervention is usually only considered for severe sprains of the ligament or for individuals who do not improve after 2 to 6 months of non-surgical treatment. Surgical interventions require 4 to 6 months before a return to sports is possible. MEDICATION  If pain medication is necessary, nonsteroidal anti-inflammatory medications, such as aspirin and ibuprofen, or other minor pain relievers, such as acetaminophen, are often recommended.  Do not take pain medication for 7 days before  surgery.  Prescription pain relievers may be given by your caregiver. Use only as directed and only as much as you need.  Ointments applied to the skin may be helpful.  Corticosteroid injections may be given to reduce inflammation. HEAT AND COLD  Cold treatment (icing) relieves pain and reduces inflammation. Cold treatment should be applied for 10 to 15 minutes every 2 to 3 hours for inflammation and pain and immediately after any activity that aggravates your symptoms. Use ice packs or an ice massage.  Heat treatment may be used prior to performing the stretching and strengthening activities prescribed by your caregiver, physical therapist or athletic trainer. Use a heat pack or a warm soak. SEEK IMMEDIATE MEDICAL CARE IF:   Pain, swelling or bruising worsens despite treatment.  There is pain, numbness or coldness in the arm.  Discoloration appears in the fingernails.  New, unexplained symptoms develop. EXERCISES  RANGE OF MOTION (ROM) AND STRETCHING EXERCISES  Acromioclavicular Separation These exercises may help you when beginning to rehabilitate your injury. Your symptoms may resolve with or without further involvement from your physician, physical therapist or athletic trainer. While completing these exercises, remember:  Restoring tissue flexibility helps normal motion to return to the joints. This allows healthier, less painful movement and activity.  An effective stretch should be held for at least 30 seconds.  A stretch should never be painful. You should only feel a gentle lengthening or release in the stretched tissue. ROM Pendulum  Bend at the waist so that your right / left arm falls away from your body. Support yourself with your opposite hand on a solid surface, such as a table or a countertop.  Your right / left arm should be perpendicular to the ground. If it is not perpendicular, you need to lean over farther. Relax the muscles in your right / left arm and shoulder  as much as possible.  Gently sway your hips and trunk so they move your right / left arm without any use of your right / left shoulder muscles.  Progress your movements so that your right / left arm moves side to side, then forward and backward, and finally, both clockwise and counterclockwise.  Complete __________ repetitions in each direction. Many people use this exercise to relieve discomfort in their shoulder as well as to gain range of motion. Repeat __________ times. Complete this exercise __________ times per day. STRETCH  Flexion, Seated   Sit in a firm chair so that your right / left forearm can rest on a table or countertop. Your right / left elbow should rest below the height of your shoulder so that your shoulder feels supported and not tense or uncomfortable.  Keeping your right / left shoulder relaxed, lean forward at your waist, allowing your right / left hand to slide forward. Bend forward until you feel a moderate stretch  in your shoulder, but before you feel an increase in your pain.  Hold __________ seconds. Slowly return to your starting position. Repeat __________ times. Complete this exercise __________ times per day. STRETCH  Flexion, Standing  Stand with good posture. With an underhand grip on your right / left and an overhand grip on the opposite hand, grasp a broomstick or cane so that your hands are a little more than shoulder-width apart.  Keeping your right / left elbow straight and shoulder muscles relaxed, push the stick with your opposite hand to raise your right / left arm in front of your body and then overhead. Raise your arm until you feel a stretch in your right / left shoulder, but before you have increased shoulder pain.  Try to avoid shrugging your right / left shoulder as your arm rises by keeping your shoulder blade tucked down and toward your mid-back spine. Hold __________ seconds.  Slowly return to the starting position. Repeat __________ times.  Complete this exercise __________ times per day. STRENGTHENING EXERCISES  Acromioclavicular Separation These exercises may help you when beginning to rehabilitate your injury. They may resolve your symptoms with or without further involvement from your physician, physical therapist or athletic trainer. While completing these exercises, remember:  Muscles can gain both the endurance and the strength needed for everyday activities through controlled exercises.  Complete these exercises as instructed by your physician, physical therapist or athletic trainer. Progress the resistance and repetitions only as guided.  You may experience muscle soreness or fatigue, but the pain or discomfort you are trying to eliminate should never worsen during these exercises. If this pain does worsen, stop and make certain you are following the directions exactly. If the pain is still present after adjustments, discontinue the exercise until you can discuss the trouble with your clinician. STRENGTH Shoulder Abductors, Isometric   With good posture, stand or sit about 4-6 inches from a wall with your right / left side facing the wall.  Bend your right / left elbow. Gently press your right / left elbow into the wall. Increase the pressure gradually until you are pressing as hard as you can without shrugging your shoulder or increasing any shoulder discomfort.  Hold __________ seconds.  Release the tension slowly. Relax your shoulder muscles completely before you start the next repetition. Repeat __________ times. Complete this exercise __________ times per day. STRENGTH  Internal Rotators, Isometric  Keep your right / left elbow at your side and bend it 90 degrees.  Step into a door frame so that the inside of your right / left wrist can press against the door frame without your upper arm leaving your side.  Gently press your right / left wrist into the door frame as if you were trying to draw the palm of your hand  to your abdomen. Gradually increase the tension until you are pressing as hard as you can without shrugging your shoulder or increasing any shoulder discomfort.  Hold __________ seconds.  Release the tension slowly. Relax your shoulder muscles completely before you the next repetition. Repeat __________ times. Complete this exercise __________ times per day.  STRENGTH  External Rotators, Isometric  Keep your right / left elbow at your side and bend it 90 degrees.  Step into a door frame so that the outside of your right / left wrist can press against the door frame without your upper arm leaving your side.  Gently press your right / left wrist into the door frame as  if you were trying to swing the back of your hand away from your abdomen. Gradually increase the tension until you are pressing as hard as you can without shrugging your shoulder or increasing any shoulder discomfort.  Hold __________ seconds.  Release the tension slowly. Relax your shoulder muscles completely before you the next repetition. Repeat __________ times. Complete this exercise __________ times per day. STRENGTH  Internal Rotators  Secure a rubber exercise band/tubing to a fixed object so that it is at the same height as your right / left elbow when you are standing or sitting on a firm surface.  Stand or sit so that the secured exercise band/tubing is at your right / left side.  Bend your elbow 90 degrees. Place a folded towel or small pillow under your right / left arm so that your elbow is a few inches away from your side.  Keeping the tension on the exercise band/tubing, pull it across your body toward your abdomen. Be sure to keep your body steady so that the movement is only coming from your shoulder rotating.  Hold __________ seconds. Release the tension in a controlled manner as you return to the starting position. Repeat __________ times. Complete this exercise __________ times per day. STRENGTH  External  Rotators  Secure a rubber exercise band/tubing to a fixed object so that it is at the same height as your right / left elbow when you are standing or sitting on a firm surface.  Stand or sit so that the secured exercise band/tubing is at your side that is not injured.  Bend your elbow 90 degrees. Place a folded towel or small pillow under your right / left arm so that your elbow is a few inches away from your side.  Keeping the tension on the exercise band/tubing, pull it away from your body, as if pivoting on your elbow. Be sure to keep your body steady so that the movement is only coming from your shoulder rotating.  Hold __________ seconds. Release the tension in a controlled manner as you return to the starting position. Repeat __________ times. Complete this exercise __________ times per day. Document Released: 02/04/2005 Document Revised: 04/29/2011 Document Reviewed: 05/19/2008 [facility] Patient Information 2014 [place], [place].

## 2012-07-26 NOTE — Progress Notes (Signed)
Subjective:    Patient ID: Tyler Murphy, male    DOB: Mar 05, 1960, 52 y.o.   MRN: [id number] Chief Complaint  Patient presents with  . Shoulder Pain   HPI For the past 24 hrs, Mr. Wassenaar has been really sore on top of his left shoulder.  No h/o any injury or shoulder problems prior. He did not do anything out of the ordinary. He did do some yard work this weekend but always does.  Has not tried any otc meds or icing or other topical treatments.  Mr. Salzwedel works out of town so wanted to come in today as if it got worse during the wk, he couldn't make it in.  Past Medical History  Diagnosis Date  . ALLERGIC RHINITIS 09/21/2007  . ALLERGY 11/26/2006  . BACK PAIN, LUMBAR, CHRONIC 11/26/2006  . GERD 09/21/2007  . HYPERCHOLESTEROLEMIA 11/26/2006  . 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 OBESITY, MILD 11/26/2006   Current Outpatient Prescriptions on File Prior to Visit  Medication Sig Dispense Refill  . aspirin 81 MG tablet Take 81 mg by mouth daily.        [name] atorvastatin (LIPITOR) 20 MG tablet Take 1 tablet (20 mg total) by mouth daily.  90 tablet  3   No current facility-administered medications on file prior to visit.   No Known Allergies  Review of Systems  Constitutional: Negative for fever, chills, diaphoresis, activity change and appetite change.  HENT: Negative for neck pain and neck stiffness.   Gastrointestinal: Negative for nausea, vomiting and abdominal pain.  Genitourinary: Negative for frequency, flank pain and decreased urine volume.  Musculoskeletal: Positive for myalgias and arthralgias. Negative for back pain, joint swelling and gait problem.  Skin: Negative for color change, rash and wound.  Neurological: Negative for weakness and numbness.  Hematological: Negative for adenopathy. Does not bruise/bleed easily.  Psychiatric/Behavioral: Negative for sleep disturbance.      BP 116/74  Pulse 70  Temp(Src) 98 F (36.7 C) (Oral)  Resp 16  Ht 5\' 9"  (1.753 m)  Wt 188 lb 12.8  oz (85.639 kg)  BMI 27.87 kg/m2  SpO2 98% Objective:   Physical Exam  Constitutional: He is oriented to person, place, and time. He appears well-developed and well-nourished. No distress.  HENT:  Head: Normocephalic and atraumatic.  Eyes: No scleral icterus.  Pulmonary/Chest: Effort normal.  Musculoskeletal:       Right shoulder: He exhibits normal range of motion, no tenderness, no bony tenderness, no swelling, no effusion, no crepitus, no pain, no spasm and normal strength.       Left shoulder: He exhibits tenderness, bony tenderness and swelling. He exhibits normal range of motion, no crepitus and normal strength.  ttp with mild swelling over left AC joint, no coracoid or acromion tenderness, norm ROM and norm rotator cuff strength  Neurological: He is alert and oriented to person, place, and time. He has normal strength. He displays no atrophy and no tremor. He exhibits normal muscle tone. Gait normal.  Skin: Skin is warm and dry. He is not diaphoretic.  Psychiatric: He has a normal mood and affect. His behavior is normal.       [facility] reading (PRIMARY) by  Dr. Clelia Croft. 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 No acute abnormality. Assessment & Plan:  Acromioclavicular joint pain, left - Plan: DG Shoulder Left For some reason developed acute flair of OA - advised nsaid (try diclofenac) and freq ice.  Handout given with stretching and stregthening exercises. Given rx  for norco in case pain worsens during the wk or needs at night. If not resolved in 1 wk, RTC to consider [facility] cortisone injection.  Meds ordered this encounter  Medications  . diclofenac (VOLTAREN) 75 MG EC tablet    Sig: Take 1 tablet (75 mg total) by mouth 2 (two) times daily.    Dispense:  30 tablet    Refill:  2  . HYDROcodone-acetaminophen (NORCO) 5-325 MG per tablet    Sig: Take 1 tablet by mouth every 6 (six) hours as needed for pain.    Dispense:  30 tablet    Refill:  0

## 2012-07-27 DIAGNOSIS — M19019 Primary osteoarthritis, unspecified shoulder: Secondary | ICD-10-CM | POA: Insufficient documentation

## 2012-09-20 ENCOUNTER — Emergency Department (HOSPITAL_COMMUNITY)
Admission: EM | Admit: 2012-09-20 | Discharge: 2012-09-20 | Disposition: A | Discharge status: Home / Self Care | Payer: BC Managed Care – PPO | Attending: Emergency Medicine | Admitting: Emergency Medicine

## 2012-09-20 ENCOUNTER — Encounter (HOSPITAL_COMMUNITY): Payer: Self-pay | Admitting: Emergency Medicine

## 2012-09-20 DIAGNOSIS — T6391XA Toxic effect of contact with unspecified venomous animal, accidental (unintentional), initial encounter: Secondary | ICD-10-CM | POA: Insufficient documentation

## 2012-09-20 DIAGNOSIS — Z7982 Long term (current) use of aspirin: Secondary | ICD-10-CM | POA: Insufficient documentation

## 2012-09-20 DIAGNOSIS — Z79899 Other long term (current) drug therapy: Secondary | ICD-10-CM | POA: Insufficient documentation

## 2012-09-20 DIAGNOSIS — E669 Obesity, unspecified: Secondary | ICD-10-CM | POA: Insufficient documentation

## 2012-09-20 DIAGNOSIS — Y9389 Activity, other specified: Secondary | ICD-10-CM | POA: Insufficient documentation

## 2012-09-20 DIAGNOSIS — Z87442 Personal history of urinary calculi: Secondary | ICD-10-CM | POA: Insufficient documentation

## 2012-09-20 DIAGNOSIS — L509 Urticaria, unspecified: Secondary | ICD-10-CM | POA: Insufficient documentation

## 2012-09-20 DIAGNOSIS — M7989 Other specified soft tissue disorders: Secondary | ICD-10-CM | POA: Insufficient documentation

## 2012-09-20 DIAGNOSIS — Z8709 Personal history of other diseases of the respiratory system: Secondary | ICD-10-CM | POA: Insufficient documentation

## 2012-09-20 DIAGNOSIS — Y929 Unspecified place or not applicable: Secondary | ICD-10-CM | POA: Insufficient documentation

## 2012-09-20 DIAGNOSIS — T63461A Toxic effect of venom of wasps, accidental (unintentional), initial encounter: Secondary | ICD-10-CM | POA: Insufficient documentation

## 2012-09-20 DIAGNOSIS — G8929 Other chronic pain: Secondary | ICD-10-CM | POA: Insufficient documentation

## 2012-09-20 DIAGNOSIS — Z8719 Personal history of other diseases of the digestive system: Secondary | ICD-10-CM | POA: Insufficient documentation

## 2012-09-20 MED ORDER — PREDNISONE 20 MG PO TABS: 40.0000 mg | ORAL_TABLET | Freq: Every day | ORAL | Status: DC

## 2012-09-20 MED ORDER — DIPHENHYDRAMINE HCL 50 MG/ML IJ SOLN
INTRAMUSCULAR | Status: AC
  Filled 2012-09-20: qty 1

## 2012-09-20 MED ORDER — FAMOTIDINE 20 MG PO TABS: 20.0000 mg | ORAL_TABLET | Freq: Two times a day (BID) | ORAL | Status: DC

## 2012-09-20 MED ORDER — DEXAMETHASONE SODIUM PHOSPHATE 10 MG/ML IJ SOLN
10.0000 mg | Freq: Once | INTRAMUSCULAR | Status: AC
  Administered 2012-09-20: 10 mg via INTRAVENOUS
  Filled 2012-09-20 (×2): qty 1

## 2012-09-20 MED ORDER — DIPHENHYDRAMINE HCL 50 MG/ML IJ SOLN
25.0000 mg | Freq: Once | INTRAMUSCULAR | Status: AC
  Administered 2012-09-20: 25 mg via INTRAVENOUS

## 2012-09-20 MED ORDER — FAMOTIDINE IN NACL 20-0.9 MG/50ML-% IV SOLN
20.0000 mg | Freq: Once | INTRAVENOUS | Status: AC
  Administered 2012-09-20: 20 mg via INTRAVENOUS
  Filled 2012-09-20: qty 50

## 2012-09-20 NOTE — ED Notes (Signed)
Pt was stung by bees/hornets several times. Has had allergic rx before but not this bad. No airway involvement. Speaking in complete sxs. Red rash on entire body. Bumps. Itchy and painful.

## 2012-09-20 NOTE — ED Provider Notes (Signed)
CSN: [id number]     Arrival date & time 09/20/12  1849 History  This chart was scribed for Tyler Emery, PA-C working with Leonette Most B. Bernette Mayers, MD by Greggory Stallion, ED scribe. This patient was seen in room WTR5/WTR5 and the patient's care was started at 6:53 PM.   Chief Complaint  Patient presents with  . Allergic Reaction  . Insect Bite   The history is provided by the patient. No language interpreter was used.    HPI Comments: Tyler Murphy is a 52 y.o. male who presents to the Emergency Department complaining of sudden onset, gradually worsening itchy rash due to bee stings that started about 30 minutes ago. Pt states he was stung on his legs and the rash has spread up to his arms. He states he has been stung before and normally just has localized swelling. It's never been this bad. He states he took 2 benadryl with no relief. Pt denies trouble breathing or throat swelling as associated symptoms.    Past Medical History  Diagnosis Date  . ALLERGIC RHINITIS 09/21/2007  . ALLERGY 11/26/2006  . BACK PAIN, LUMBAR, CHRONIC 11/26/2006  . GERD 09/21/2007  . HYPERCHOLESTEROLEMIA 11/26/2006  . 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 OBESITY, MILD 11/26/2006   Past Surgical History  Procedure Laterality Date  . Tonsillectomy    . S/p lithotrypsy     Family History  Problem Relation Age of Onset  . Adopted: Yes   History  Substance Use Topics  . Smoking status: Never Smoker   . Smokeless tobacco: Not on file  . Alcohol Use: No    Review of Systems  A complete 10 system review of systems was obtained and all systems are negative except as noted in the HPI and PMH.   Allergies  Review of patient's allergies indicates no known allergies.  Home Medications   Current Outpatient Rx  Name  Route  Sig  Dispense  Refill  . aspirin 81 MG tablet   Oral   Take 81 mg by mouth daily.           [name] atorvastatin (LIPITOR) 20 MG tablet   Oral   Take 1 tablet (20 mg total) by mouth daily.    90 tablet   3   . diclofenac (VOLTAREN) 75 MG EC tablet   Oral   Take 1 tablet (75 mg total) by mouth 2 (two) times daily.   30 tablet   2   . HYDROcodone-acetaminophen (NORCO) 5-325 MG per tablet   Oral   Take 1 tablet by mouth every 6 (six) hours as needed for pain.   30 tablet   0    BP 103/63  Pulse 130  Temp(Src) 97.9 F (36.6 C) (Oral)  SpO2 100%  Physical Exam  Nursing note and vitals reviewed. Constitutional: He is oriented to person, place, and time. He appears well-developed and well-nourished. No distress.  HENT:  Head: Normocephalic.  Mouth/Throat: Oropharynx is clear and moist.  Eyes: Conjunctivae and EOM are normal. Pupils are equal, round, and reactive to light.  Cardiovascular: Normal rate and regular rhythm.   Pulmonary/Chest: Effort normal and breath sounds normal. No stridor. No respiratory distress. He has no wheezes. He has no rales. He exhibits no tenderness.  Abdominal: Soft. Bowel sounds are normal. He exhibits no distension and no mass. There is no tenderness. There is no rebound and no guarding.  Musculoskeletal: Normal range of motion.  Neurological: He is alert and oriented to person, place,  and time.  Skin:  2 discrete erythematous bites to bilateral lower extremities patient has diffuse confluent hives to 4 extremities and torso.  Psychiatric: He has a normal mood and affect.    ED Course   Procedures (including critical care time)  DIAGNOSTIC STUDIES: Oxygen Saturation is 100% on RA, normal by my interpretation.    COORDINATION OF CARE: 6:57 PM-Discussed treatment plan which includes decadron and Pepcid with pt at bedside and pt agreed to plan.   Labs Reviewed - No data to display No results found. 1. Allergic reaction to bee sting, initial encounter     MDM   Filed Vitals:   09/20/12 1856 09/20/12 1947  BP: 103/63 102/69  Pulse: 130 75  Temp: 97.9 F (36.6 C) 98 F (36.7 C)  TempSrc: Oral Oral  Resp:  16  SpO2: 100% 99%      Tyler Murphy is a 52 y.o. male Patient re-evaluated prior to dc, is hemodynamically stable, in no respiratory distress, and denies the feeling of throat closing. Pt has been advised to take OTC benadryl & return to the ED if they have a mod-severe allergic rxn (s/s including throat closing, difficulty breathing, swelling of lips face or tongue). Pt is to follow up with their PCP. Pt is agreeable with plan & verbalizes understanding.  Medications  dexamethasone (DECADRON) injection 10 mg (10 mg Intravenous Given 09/20/12 1915)  famotidine (PEPCID) IVPB 20 mg (0 mg Intravenous Stopped 09/20/12 1958)  diphenhydrAMINE (BENADRYL) injection 25 mg (25 mg Intravenous Given 09/20/12 1910)    Pt is hemodynamically stable, appropriate for, and amenable to discharge at this time. Pt verbalized understanding and agrees with care plan. All questions answered. Outpatient follow-up and specific return precautions discussed.    Discharge Medication List as of 09/20/2012  7:06 PM    START taking these medications   Details  famotidine (PEPCID) 20 MG tablet Take 1 tablet (20 mg total) by mouth 2 (two) times daily., Starting 09/20/2012, Until Discontinued, Print    predniSONE (DELTASONE) 20 MG tablet Take 2 tablets (40 mg total) by mouth daily., Starting 09/20/2012, Until Discontinued, Print        I personally performed the services described in this documentation, which was scribed in my presence. The recorded information has been reviewed and is accurate.  Note: Portions of this report may have been transcribed using voice recognition software. Every effort was made to ensure accuracy; however, inadvertent computerized transcription errors may be present    Tyler Emery, PA-C 09/21/12 1626

## 2012-09-22 NOTE — ED Provider Notes (Signed)
Medical screening examination/treatment/procedure(s) were performed by non-physician practitioner and as supervising physician I was immediately available for consultation/collaboration.   Charles B. Sheldon, MD 09/22/12 1931 

## 2012-11-04 ENCOUNTER — Other Ambulatory Visit (INDEPENDENT_AMBULATORY_CARE_PROVIDER_SITE_OTHER): Payer: BC Managed Care – PPO

## 2012-11-04 DIAGNOSIS — Z0000 Encounter for general adult medical examination without abnormal findings: Secondary | ICD-10-CM

## 2012-11-04 LAB — TSH: TSH: 1.71 u[IU]/mL (ref 0.35–5.50)

## 2012-11-04 LAB — HEPATIC FUNCTION PANEL
ALT: 25 U/L (ref 0–53)
AST: 17 U/L (ref 0–37)
Total Bilirubin: 0.9 mg/dL (ref 0.3–1.2)
Total Protein: 6.5 g/dL (ref 6.0–8.3)

## 2012-11-04 LAB — CBC WITH DIFFERENTIAL/PLATELET
Basophils Relative: 0.5 % (ref 0.0–3.0)
Eosinophils Relative: 2.3 % (ref 0.0–5.0)
HCT: 46.7 % (ref 39.0–52.0)
Hemoglobin: 15.9 g/dL (ref 13.0–17.0)
Lymphs Abs: 1.5 10*3/uL (ref 0.7–4.0)
Monocytes Relative: 9.4 % (ref 3.0–12.0)
Platelets: 169 10*3/uL (ref 150.0–400.0)
RBC: 5.22 Mil/uL (ref 4.22–5.81)
WBC: 5.6 10*3/uL (ref 4.5–10.5)

## 2012-11-04 LAB — URINALYSIS, ROUTINE W REFLEX MICROSCOPIC
Bilirubin Urine: NEGATIVE
Hgb urine dipstick: NEGATIVE
Ketones, ur: NEGATIVE
Total Protein, Urine: NEGATIVE
Urine Glucose: NEGATIVE
Urobilinogen, UA: 0.2 (ref 0.0–1.0)
WBC, UA: NONE SEEN (ref 0–?)

## 2012-11-04 LAB — BASIC METABOLIC PANEL
BUN: 19 mg/dL (ref 6–23)
Calcium: 9.5 mg/dL (ref 8.4–10.5)
GFR: 75.92 mL/min (ref >60.00)
Glucose, Bld: 76 mg/dL (ref 70–99)
Sodium: 138 mEq/L (ref 135–145)

## 2012-11-05 ENCOUNTER — Ambulatory Visit (INDEPENDENT_AMBULATORY_CARE_PROVIDER_SITE_OTHER): Payer: BC Managed Care – PPO | Admitting: Internal Medicine

## 2012-11-05 ENCOUNTER — Encounter: Payer: Self-pay | Admitting: Internal Medicine

## 2012-11-05 VITALS — BP 102/80 | HR 73 | Temp 98.9°F | Ht 70.0 in | Wt 190.4 lb

## 2012-11-05 DIAGNOSIS — Z0000 Encounter for general adult medical examination without abnormal findings: Secondary | ICD-10-CM

## 2012-11-05 DIAGNOSIS — Z23 Encounter for immunization: Secondary | ICD-10-CM

## 2012-11-05 DIAGNOSIS — R339 Retention of urine, unspecified: Secondary | ICD-10-CM

## 2012-11-05 DIAGNOSIS — R351 Nocturia: Secondary | ICD-10-CM | POA: Insufficient documentation

## 2012-11-05 DIAGNOSIS — E7800 Pure hypercholesterolemia, unspecified: Secondary | ICD-10-CM

## 2012-11-05 MED ORDER — TAMSULOSIN HCL 0.4 MG PO CAPS: 0.4000 mg | ORAL_CAPSULE | Freq: Every day | ORAL | Status: DC

## 2012-11-05 MED ORDER — LOVASTATIN 40 MG PO TABS: 40.0000 mg | ORAL_TABLET | Freq: Every day | ORAL | Status: DC

## 2012-11-05 NOTE — Patient Instructions (Addendum)
You had the flu shot today Your EKG was ok today You are given the lab results today Please take all new medication as prescribed - the flomax, and the cholesterol medication Please continue all other medications as before, and refills have been done if requested. Please continue your efforts at being more active, low cholesterol diet, and weight control. You are otherwise up to date with prevention measures today.  Please remember to sign up for My Chart if you have not done so, as this will be important to you in the future with finding out test results, communicating by private email, and scheduling acute appointments online when needed.  Please return in 1 year for your yearly visit, or sooner if needed, with Lab testing done 3-5 days before

## 2012-11-05 NOTE — Assessment & Plan Note (Addendum)

## 2012-11-05 NOTE — Progress Notes (Signed)
Subjective:    Patient ID: Tyler Murphy, male    DOB: Jul 03, 1960, 52 y.o.   MRN: [id number]  HPI Here for wellness and f/u;  Overall doing ok;  Pt denies CP, worsening SOB, DOE, wheezing, orthopnea, PND, worsening LE edema, palpitations, dizziness or syncope.  Pt denies neurological change such as new headache, facial or extremity weakness.  Pt denies polydipsia, polyuria, or low sugar symptoms. Pt states overall good compliance with treatment and medications, good tolerability, and has been trying to follow lower cholesterol diet.  Pt denies worsening depressive symptoms, suicidal ideation or panic. No fever, night sweats, wt loss, loss of appetite, or other constitutional symptoms.  Pt states good ability with ADL's, has low fall risk, home safety reviewed and adequate, no other significant changes in hearing or vision, and only occasionally active with exercise, does some walking outside in the evening with wife. Did have an allergy rxn to wasp or yellowjacket several months ago, now resolved.  Does have some urinary retention type symptoms.  Past Medical History  Diagnosis Date  . ALLERGIC RHINITIS 09/21/2007  . ALLERGY 11/26/2006  . BACK PAIN, LUMBAR, CHRONIC 11/26/2006  . GERD 09/21/2007  . HYPERCHOLESTEROLEMIA 11/26/2006  . 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 OBESITY, MILD 11/26/2006   Past Surgical History  Procedure Laterality Date  . Tonsillectomy    . S/p lithotrypsy      reports that he has never smoked. He does not have any smokeless tobacco history on file. He reports that he does not drink alcohol or use illicit drugs. family history is not on file. He was adopted. Allergies  Allergen Reactions  . Bee Venom Hives and Itching  . Lipitor [Atorvastatin] Other (See Comments)    myalgia   Current Outpatient Prescriptions on File Prior to Visit  Medication Sig Dispense Refill  . aspirin 81 MG tablet Take 81 mg by mouth daily.        [name] atorvastatin (LIPITOR) 20 MG tablet Take 1  tablet (20 mg total) by mouth daily.  90 tablet  3   No current facility-administered medications on file prior to visit.    Review of Systems Constitutional: Negative for diaphoresis, activity change, appetite change or unexpected weight change.  HENT: Negative for hearing loss, ear pain, facial swelling, mouth sores and neck stiffness.   Eyes: Negative for pain, redness and visual disturbance.  Respiratory: Negative for shortness of breath and wheezing.   Cardiovascular: Negative for chest pain and palpitations.  Gastrointestinal: Negative for diarrhea, blood in stool, abdominal distention or other pain Genitourinary: Negative for hematuria, flank pain or change in urine volume.  Musculoskeletal: Negative for myalgias and joint swelling.  Skin: Negative for color change and wound.  Neurological: Negative for syncope and numbness. other than noted Hematological: Negative for adenopathy.  Psychiatric/Behavioral: Negative for hallucinations, self-injury, decreased concentration and agitation.      Objective:   Physical Exam BP 102/80  Pulse 73  Temp(Src) 98.9 F (37.2 C) (Oral)  Ht 5\' 10"  (1.778 m)  Wt 190 lb 6 oz (86.354 kg)  BMI 27.32 kg/m2  SpO2 96% VS noted,  Constitutional: Pt is oriented to person, place, and time. Appears well-developed and well-nourished.  Head: Normocephalic and atraumatic.  Right Ear: External ear normal.  Left Ear: External ear normal.  Nose: Nose normal.  Mouth/Throat: Oropharynx is clear and moist.  Eyes: Conjunctivae and EOM are normal. Pupils are equal, round, and reactive to light.  Neck: Normal range of motion. Neck supple.  No JVD present. No tracheal deviation present.  Cardiovascular: Normal rate, regular rhythm, normal heart sounds and intact distal pulses.   Pulmonary/Chest: Effort normal and breath sounds normal.  Abdominal: Soft. Bowel sounds are normal. There is no tenderness. No HSM  Musculoskeletal: Normal range of motion. Exhibits  no edema.  Lymphadenopathy:  Has no cervical adenopathy.  Neurological: Pt is alert and oriented to person, place, and time. Pt has normal reflexes. No cranial nerve deficit.  Skin: Skin is warm and dry. No rash noted.  Psychiatric:  Has  normal mood and affect. Behavior is normal.     Assessment & Plan:

## 2012-11-05 NOTE — Assessment & Plan Note (Signed)
prob early underlying BPH for age, for trial flomax,  to f/u any worsening symptoms or concerns

## 2012-11-05 NOTE — Assessment & Plan Note (Signed)
Did not tolerate lipitor, to change to lovastatin 40

## 2013-09-16 ENCOUNTER — Encounter: Payer: Self-pay | Admitting: Cardiovascular Disease

## 2013-09-16 ENCOUNTER — Encounter: Payer: Self-pay | Admitting: Gastroenterology

## 2013-11-03 ENCOUNTER — Other Ambulatory Visit (INDEPENDENT_AMBULATORY_CARE_PROVIDER_SITE_OTHER): Payer: Managed Care, Other (non HMO)

## 2013-11-03 DIAGNOSIS — Z0000 Encounter for general adult medical examination without abnormal findings: Secondary | ICD-10-CM

## 2013-11-03 LAB — BASIC METABOLIC PANEL
BUN: 22 mg/dL (ref 6–23)
CALCIUM: 9.2 mg/dL (ref 8.4–10.5)
CHLORIDE: 103 meq/L (ref 96–112)
CO2: 29 meq/L (ref 19–32)
CREATININE: 1.3 mg/dL (ref 0.4–1.5)
GFR: 59.48 mL/min — ABNORMAL LOW (ref >60.00)
Glucose, Bld: 87 mg/dL (ref 70–99)
Potassium: 4.4 mEq/L (ref 3.5–5.1)
Sodium: 138 mEq/L (ref 135–145)

## 2013-11-03 LAB — LIPID PANEL
CHOL/HDL RATIO: 8
CHOLESTEROL: 213 mg/dL — AB (ref 0–200)
HDL: 27.5 mg/dL — ABNORMAL LOW (ref >39.00)
LDL CALC: 152 mg/dL — AB (ref 0–99)
NonHDL: 185.5
TRIGLYCERIDES: 168 mg/dL — AB (ref 0.0–149.0)
VLDL: 33.6 mg/dL (ref 0.0–40.0)

## 2013-11-03 LAB — CBC WITH DIFFERENTIAL/PLATELET
BASOS PCT: 0.5 % (ref 0.0–3.0)
Basophils Absolute: 0 10*3/uL (ref 0.0–0.1)
EOS ABS: 0.2 10*3/uL (ref 0.0–0.7)
Eosinophils Relative: 3.1 % (ref 0.0–5.0)
HCT: 48.5 % (ref 39.0–52.0)
HEMOGLOBIN: 16.6 g/dL (ref 13.0–17.0)
LYMPHS PCT: 29.6 % (ref 12.0–46.0)
Lymphs Abs: 1.6 10*3/uL (ref 0.7–4.0)
MCHC: 34.2 g/dL (ref 30.0–36.0)
MCV: 89.9 fl (ref 78.0–100.0)
MONOS PCT: 7.8 % (ref 3.0–12.0)
Monocytes Absolute: 0.4 10*3/uL (ref 0.1–1.0)
NEUTROS ABS: 3.3 10*3/uL (ref 1.4–7.7)
NEUTROS PCT: 59 % (ref 43.0–77.0)
Platelets: 159 10*3/uL (ref 150.0–400.0)
RBC: 5.39 Mil/uL (ref 4.22–5.81)
RDW: 13 % (ref 11.5–15.5)
WBC: 5.6 10*3/uL (ref 4.0–10.5)

## 2013-11-03 LAB — HEPATIC FUNCTION PANEL
ALBUMIN: 4.3 g/dL (ref 3.5–5.2)
ALK PHOS: 62 U/L (ref 39–117)
ALT: 33 U/L (ref 0–53)
AST: 21 U/L (ref 0–37)
BILIRUBIN DIRECT: 0.1 mg/dL (ref 0.0–0.3)
TOTAL PROTEIN: 6.9 g/dL (ref 6.0–8.3)
Total Bilirubin: 1 mg/dL (ref 0.2–1.2)

## 2013-11-03 LAB — URINALYSIS, ROUTINE W REFLEX MICROSCOPIC
BILIRUBIN URINE: NEGATIVE
HGB URINE DIPSTICK: NEGATIVE
KETONES UR: NEGATIVE
Leukocytes, UA: NEGATIVE
Nitrite: NEGATIVE
Specific Gravity, Urine: 1.02 (ref 1.000–1.030)
TOTAL PROTEIN, URINE-UPE24: NEGATIVE
URINE GLUCOSE: NEGATIVE
UROBILINOGEN UA: 0.2 (ref 0.0–1.0)
pH: 5.5 (ref 5.0–8.0)

## 2013-11-03 LAB — TSH: TSH: 1.92 u[IU]/mL (ref 0.35–4.50)

## 2013-11-03 LAB — PSA: PSA: 2.88 ng/mL (ref 0.10–4.00)

## 2013-11-10 ENCOUNTER — Encounter: Payer: Self-pay | Admitting: Internal Medicine

## 2013-11-10 ENCOUNTER — Ambulatory Visit (INDEPENDENT_AMBULATORY_CARE_PROVIDER_SITE_OTHER): Payer: Managed Care, Other (non HMO) | Admitting: Internal Medicine

## 2013-11-10 ENCOUNTER — Encounter (INDEPENDENT_AMBULATORY_CARE_PROVIDER_SITE_OTHER): Payer: Self-pay

## 2013-11-10 VITALS — BP 112/72 | HR 82 | Temp 98.7°F | Wt 201.0 lb

## 2013-11-10 DIAGNOSIS — Z0000 Encounter for general adult medical examination without abnormal findings: Secondary | ICD-10-CM

## 2013-11-10 DIAGNOSIS — Z23 Encounter for immunization: Secondary | ICD-10-CM

## 2013-11-10 DIAGNOSIS — N183 Chronic kidney disease, stage 3 unspecified: Secondary | ICD-10-CM

## 2013-11-10 DIAGNOSIS — E7800 Pure hypercholesterolemia, unspecified: Secondary | ICD-10-CM

## 2013-11-10 DIAGNOSIS — E785 Hyperlipidemia, unspecified: Secondary | ICD-10-CM

## 2013-11-10 DIAGNOSIS — N182 Chronic kidney disease, stage 2 (mild): Secondary | ICD-10-CM | POA: Insufficient documentation

## 2013-11-10 DIAGNOSIS — R972 Elevated prostate specific antigen [PSA]: Secondary | ICD-10-CM | POA: Insufficient documentation

## 2013-11-10 MED ORDER — ROSUVASTATIN CALCIUM 10 MG PO TABS: 10.0000 mg | ORAL_TABLET | Freq: Every day | ORAL | Status: DC

## 2013-11-10 MED ORDER — ASPIRIN 81 MG PO TABS: 81.0000 mg | ORAL_TABLET | Freq: Every day | ORAL | Status: DC

## 2013-11-10 NOTE — Assessment & Plan Note (Signed)

## 2013-11-10 NOTE — Assessment & Plan Note (Signed)
Borderline significant, for f/u psa with labs at 6 mo

## 2013-11-10 NOTE — Assessment & Plan Note (Signed)
GFR on the lower side this visit, no obvious cause, for f/u lab at 69mo

## 2013-11-10 NOTE — Progress Notes (Signed)
Pre visit review using our clinic review tool, if applicable. No additional management support is needed unless otherwise documented below in the visit note. 

## 2013-11-10 NOTE — Progress Notes (Signed)
Subjective:    Patient ID: Tyler Murphy, male    DOB: 05-23-1960, 53 y.o.   MRN: [id number]  HPI  Here for wellness and f/u;  Overall doing ok;  Pt denies CP, worsening SOB, DOE, wheezing, orthopnea, PND, worsening LE edema, palpitations, dizziness or syncope.  Pt denies neurological change such as new headache, facial or extremity weakness.  Pt denies polydipsia, polyuria, or low sugar symptoms. Pt states overall good compliance with treatment and medications, good tolerability, and has been trying to follow lower cholesterol diet.  Pt denies worsening depressive symptoms, suicidal ideation or panic. No fever, night sweats, wt loss, loss of appetite, or other constitutional symptoms.  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 Wt up about 13 lbs over last yr. Had myalgias with lovastin , could not take.  Past Medical History  Diagnosis Date  . ALLERGIC RHINITIS 09/21/2007  . ALLERGY 11/26/2006  . BACK PAIN, LUMBAR, CHRONIC 11/26/2006  . GERD 09/21/2007  . HYPERCHOLESTEROLEMIA 11/26/2006  . 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 OBESITY, MILD 11/26/2006   Past Surgical History  Procedure Laterality Date  . Tonsillectomy    . S/p lithotrypsy      reports that he has never smoked. He does not have any smokeless tobacco history on file. He reports that he does not drink alcohol or use illicit drugs. family history is not on file. He was adopted. Allergies  Allergen Reactions  . Bee Venom Hives and Itching  . Lipitor [Atorvastatin] Other (See Comments)    myalgia  . Lovastatin     myalgia   No current outpatient prescriptions on file prior to visit.   No current facility-administered medications on file prior to visit.   Review of Systems Constitutional: Negative  for increased diaphoresis, other activity, appetite or other siginficant weight change  HENT: Negative for worsening hearing loss, ear pain, facial swelling, mouth sores and neck stiffness.   Eyes: Negative for other worsening pain, redness or visual disturbance.  Respiratory: Negative for shortness of breath and wheezing.   Cardiovascular: Negative for chest pain and palpitations.  Gastrointestinal: Negative for diarrhea, blood in stool, abdominal distention or other pain Genitourinary: Negative for hematuria, flank pain or change in urine volume.  Musculoskeletal: Negative for myalgias or other joint complaints.  Skin: Negative for color change and wound.  Neurological: Negative for syncope and numbness. other than noted Hematological: Negative for adenopathy. or other swelling Psychiatric/Behavioral: Negative for hallucinations, self-injury, decreased concentration or other worsening agitation.      Objective:   Physical Exam BP 112/72  Pulse 82  Temp(Src) 98.7 F (37.1 C) (Oral)  Wt 201 lb (91.173 kg)  SpO2 97% VS noted,  Constitutional: Pt is oriented to person, place, and time. Appears well-developed and well-nourished.  Head: Normocephalic and atraumatic.  Right Ear: External ear normal.  Left Ear: External ear normal.  Nose: Nose normal.  Mouth/Throat: Oropharynx is clear and moist.  Eyes: Conjunctivae and EOM are normal. Pupils are equal, round, and reactive to light.  Neck: Normal range of motion. Neck supple. No JVD present. No tracheal deviation present.  Cardiovascular: Normal rate, regular rhythm, normal heart sounds and intact distal pulses.   Pulmonary/Chest: Effort normal and breath sounds without rales or wheezing  Abdominal: Soft.  Bowel sounds are normal. NT. No HSM  Musculoskeletal: Normal range of motion. Exhibits no edema.  Lymphadenopathy:  Has no cervical adenopathy.  Neurological: Pt is alert and oriented to person, place, and time. Pt has normal reflexes.  No cranial nerve deficit. Motor grossly intact Skin: Skin is warm and dry. No rash noted.  Psychiatric:  Has normal mood and affect. Behavior is normal.     Assessment & Plan:   Wt Readings from Last 3 Encounters:  11/10/13 201 lb (91.173 kg)  11/05/12 190 lb 6 oz (86.354 kg)  07/26/12 188 lb 12.8 oz (85.639 kg)

## 2013-11-10 NOTE — Patient Instructions (Addendum)
You had the flu shot today  Your EKG was OK today  Your blood work was [place], except for moderately elevated cholesterol  Please re-start the Aspirin 81 mg per day  OK to stay off the lovastatin  Please take all new medication as prescribed - the crestor 10 mg per day  Please have the pharmacy call with any other refills you may need.  Please continue your efforts at being more active, low cholesterol diet, and weight control.  You are otherwise up to date with prevention measures today.  Please keep your appointments with your specialists as you may have planned  Please return for LAB only in 6 months - for Kidney test, PSA and Cholesterol  You will be contacted by phone if any changes need to be made immediately.  Otherwise, you will receive a letter about your results with an explanation, but please check with MyChart first.  Please remember to sign up for MyChart if you have not done so, as this will be important to you in the future with finding out test results, communicating by private email, and scheduling acute appointments online when needed.  Please return in 1 year for your yearly visit, or sooner if needed, with Lab testing done 3-5 days before

## 2013-11-10 NOTE — Assessment & Plan Note (Signed)
To start crestor 10 qd, f/u labs at 6 mo, lower chol diet

## 2014-03-20 ENCOUNTER — Ambulatory Visit (INDEPENDENT_AMBULATORY_CARE_PROVIDER_SITE_OTHER): Payer: Managed Care, Other (non HMO) | Admitting: Internal Medicine

## 2014-03-20 VITALS — BP 140/88 | HR 74 | Temp 97.9°F | Resp 16 | Ht 69.5 in | Wt 203.6 lb

## 2014-03-20 DIAGNOSIS — M62830 Muscle spasm of back: Secondary | ICD-10-CM

## 2014-03-20 DIAGNOSIS — R1013 Epigastric pain: Secondary | ICD-10-CM

## 2014-03-20 DIAGNOSIS — M549 Dorsalgia, unspecified: Secondary | ICD-10-CM

## 2014-03-20 LAB — POCT URINALYSIS DIPSTICK
Bilirubin, UA: NEGATIVE
GLUCOSE UA: NEGATIVE
Ketones, UA: NEGATIVE
Leukocytes, UA: NEGATIVE
NITRITE UA: NEGATIVE
Protein, UA: NEGATIVE
RBC UA: NEGATIVE
SPEC GRAV UA: 1.025
UROBILINOGEN UA: 0.2
pH, UA: 6.5

## 2014-03-20 LAB — POCT UA - MICROSCOPIC ONLY
CASTS, UR, LPF, POC: NEGATIVE
CRYSTALS, UR, HPF, POC: NEGATIVE
Yeast, UA: NEGATIVE

## 2014-03-20 MED ORDER — MELOXICAM 15 MG PO TABS: 15.0000 mg | ORAL_TABLET | Freq: Every day | ORAL | Status: DC

## 2014-03-20 MED ORDER — CYCLOBENZAPRINE HCL 10 MG PO TABS: 10.0000 mg | ORAL_TABLET | Freq: Every day | ORAL | Status: DC

## 2014-03-20 NOTE — Progress Notes (Signed)
Subjective:  This chart was scribed for Tami Lin, MD by Dellis Filbert, ED Scribe at [facility].The patient was seen in exam room 03 and the patient's care was started at 8:17 AM.   Patient ID: Tyler Murphy, male    DOB: April 18, 1960, 54 y.o.   MRN: [id number] Chief Complaint  Patient presents with  . Back Pain    x 4 days   HPI HPI Comments: Tyler Murphy is a 54 y.o. male who presents to [facility] complaining of left sided back pain, onset 4 days ago. He describes the pain as non radiating and constant. Movement worsens the pain, getting in and out of the car or his chair worsens the pain. He has taken Advil for relief. He notes having trouble sleeping last night due to the pain. Pt reports possible trauma while shoveling snow. Pt states he had a herniated disc about 10 years ago, which did not require repair.  He denies nausea, hematuria, fever, and abdominal pain.  Pt also complains of one incident of indigestion and sharp shooting pain in his neck which occurred yesterday while driving. Pt cannot remember what he ate prior to the indigestion. No pain with activity. No assoc diaphor. No hx HTN or cardiac issues. Past hx GERD and has been using advil. No SOB with shoveling snow last week    Only med Crestor for HL--Dr Jenny Reichmann PCP.    Patient Active Problem List   Diagnosis Date Noted  . CKD (chronic kidney disease) stage 3, GFR 30-59 ml/min---cr 1.3 9/15 11/10/2013  . Increased prostate specific antigen (PSA) velocity 11/10/2013  . Urinary retention 11/05/2012  . 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 GERD 09/21/2007  . 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 HYPERCHOLESTEROLEMIA 11/26/2006  . BACK PAIN, LUMBAR, CHRONIC s/p disc hern that resolved with PT 11/26/2006   Past Medical History  Diagnosis Date  . ALLERGIC RHINITIS 09/21/2007  . ALLERGY 11/26/2006  . BACK PAIN, LUMBAR, CHRONIC 11/26/2006  . GERD 09/21/2007  . HYPERCHOLESTEROLEMIA 11/26/2006  .  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 OBESITY, MILD 11/26/2006   Past Surgical History  Procedure Laterality Date  . Tonsillectomy    . S/p lithotrypsy     Allergies  Allergen Reactions  . Bee Venom Hives and Itching  . Lipitor [Atorvastatin] Other (See Comments)    myalgia  . Lovastatin     myalgia   Prior to Admission medications   Medication Sig Start Date End Date Taking? Authorizing Provider  aspirin 81 MG tablet Take 1 tablet (81 mg total) by mouth daily. 11/10/13  Yes Biagio Borg, MD  rosuvastatin (CRESTOR) 10 MG tablet Take 1 tablet (10 mg total) by mouth daily. 11/10/13  Yes Biagio Borg, MD   History   Social History  . Marital Status: Married    Spouse Name: N/A    Number of Children: 3  . Years of Education: N/A   Occupational History  . 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 Smoking status: Never Smoker   . Smokeless tobacco: Not on file  . Alcohol Use: No  . Drug Use: No  . Sexual Activity: Yes   Other Topics Concern  . Not on file   Social History Narrative    Review of Systems  Constitutional: Negative for fever.  Gastrointestinal: Negative for nausea and abdominal pain.  Genitourinary: Negative  for hematuria.  Musculoskeletal: Positive for back pain and neck pain.  Psychiatric/Behavioral: Positive for sleep disturbance.      Objective:   Physical Exam  Constitutional: He is oriented to person, place, and time. He appears well-developed and well-nourished. No distress.  HENT:  Head: Normocephalic and atraumatic.  Eyes: EOM are normal. Pupils are equal, round, and reactive to light.  Neck: Normal range of motion. No thyromegaly present.  Cardiovascular: Normal rate, regular rhythm and normal heart sounds.   No murmur heard. No carotid bruits  Pulmonary/Chest: Effort normal and breath sounds normal. No respiratory distress.  Musculoskeletal: Normal range  of motion. He exhibits no edema.  Non tender to palpation in the lumbar area but has pain in the left lumbar region with twisting and tilting.  Mild discomfort with straight leg raise 90 degrees bilaterally. SI nontend/No radiation w/ SLR  Lymphadenopathy:    He has no cervical adenopathy.  Neurological: He is alert and oriented to person, place, and time. He has normal reflexes.  Skin: Skin is warm and dry.  Psychiatric: He has a normal mood and affect. His behavior is normal.  Nursing note and vitals reviewed. BP 140/88 mmHg  Pulse 74  Temp(Src) 97.9 F (36.6 C) (Oral)  Resp 16  Ht 5' 9.5" (1.765 m)  Wt 203 lb 9.6 oz (92.352 kg)  BMI 29.65 kg/m2  SpO2 99% Results for orders placed or performed in visit on 03/20/14  POCT urinalysis dipstick  Result Value Ref Range   Color, UA yellow    Clarity, UA clear    Glucose, UA neg    Bilirubin, UA neg    Ketones, UA neg    Spec Grav, UA 1.025    Blood, UA neg    pH, UA 6.5    Protein, UA neg    Urobilinogen, UA 0.2    Nitrite, UA neg    Leukocytes, UA Negative   POCT UA - Microscopic Only  Result Value Ref Range   WBC, Ur, HPF, POC 0-2    RBC, urine, microscopic 0-1    Bacteria, U Microscopic tr    Mucus, UA tr    Epithelial cells, urine per micros 1-3    Crystals, Ur, HPF, POC neg    Casts, Ur, LPF, POC neg    Yeast, UA neg        Assessment & Plan:  Lumbar paraspinal muscle spasm  Back pain, unspecified location - Plan: POCT urinalysis dipstick, POCT UA - Microscopic Only  Dyspepsia  Meds ordered this encounter  Medications  . cyclobenzaprine (FLEXERIL) 10 MG tablet    Sig: Take 1 tablet (10 mg total) by mouth at bedtime.    Dispense:  10 tablet    Refill:  0  . meloxicam (MOBIC) 15 MG tablet    Sig: Take 1 tablet (15 mg total) by mouth daily. W/ food    Dispense:  14 tablet    Refill:  0  Heat for relaxation Stretching early am. F/u for further dyspepsia or incomplete resolution of back pain    I have  completed the patient encounter in its entirety as documented by the scribe, with editing by me where necessary. Arber Wiemers P. Laney Pastor, M.D.

## 2014-11-09 ENCOUNTER — Other Ambulatory Visit (INDEPENDENT_AMBULATORY_CARE_PROVIDER_SITE_OTHER): Payer: Managed Care, Other (non HMO)

## 2014-11-09 DIAGNOSIS — Z0000 Encounter for general adult medical examination without abnormal findings: Secondary | ICD-10-CM

## 2014-11-09 LAB — URINALYSIS, ROUTINE W REFLEX MICROSCOPIC
Bilirubin Urine: NEGATIVE
Hgb urine dipstick: NEGATIVE
Ketones, ur: NEGATIVE
Leukocytes, UA: NEGATIVE
NITRITE: NEGATIVE
PH: 6.5 (ref 5.0–8.0)
RBC / HPF: NONE SEEN (ref 0–?)
SPECIFIC GRAVITY, URINE: 1.02 (ref 1.000–1.030)
TOTAL PROTEIN, URINE-UPE24: NEGATIVE
Urine Glucose: NEGATIVE
Urobilinogen, UA: 0.2 (ref 0.0–1.0)

## 2014-11-09 LAB — CBC WITH DIFFERENTIAL/PLATELET
BASOS PCT: 0.4 % (ref 0.0–3.0)
Basophils Absolute: 0 10*3/uL (ref 0.0–0.1)
EOS ABS: 0.1 10*3/uL (ref 0.0–0.7)
EOS PCT: 1.6 % (ref 0.0–5.0)
HCT: 49.7 % (ref 39.0–52.0)
HEMOGLOBIN: 16.8 g/dL (ref 13.0–17.0)
LYMPHS ABS: 1.2 10*3/uL (ref 0.7–4.0)
Lymphocytes Relative: 17.2 % (ref 12.0–46.0)
MCHC: 33.8 g/dL (ref 30.0–36.0)
MCV: 89.6 fl (ref 78.0–100.0)
MONO ABS: 0.6 10*3/uL (ref 0.1–1.0)
Monocytes Relative: 8.9 % (ref 3.0–12.0)
NEUTROS PCT: 71.9 % (ref 43.0–77.0)
Neutro Abs: 5 10*3/uL (ref 1.4–7.7)
Platelets: 158 10*3/uL (ref 150.0–400.0)
RBC: 5.55 Mil/uL (ref 4.22–5.81)
RDW: 12.7 % (ref 11.5–15.5)
WBC: 6.9 10*3/uL (ref 4.0–10.5)

## 2014-11-09 LAB — BASIC METABOLIC PANEL
BUN: 20 mg/dL (ref 6–23)
CHLORIDE: 104 meq/L (ref 96–112)
CO2: 29 mEq/L (ref 19–32)
Calcium: 9.3 mg/dL (ref 8.4–10.5)
Creatinine, Ser: 1.17 mg/dL (ref 0.40–1.50)
GFR: 68.7 mL/min (ref >60.00)
GLUCOSE: 90 mg/dL (ref 70–99)
POTASSIUM: 4.1 meq/L (ref 3.5–5.1)
SODIUM: 139 meq/L (ref 135–145)

## 2014-11-09 LAB — HEPATIC FUNCTION PANEL
ALBUMIN: 4.4 g/dL (ref 3.5–5.2)
ALK PHOS: 63 U/L (ref 39–117)
ALT: 36 U/L (ref 0–53)
AST: 20 U/L (ref 0–37)
BILIRUBIN TOTAL: 0.9 mg/dL (ref 0.2–1.2)
Bilirubin, Direct: 0.1 mg/dL (ref 0.0–0.3)
Total Protein: 6.8 g/dL (ref 6.0–8.3)

## 2014-11-09 LAB — TSH: TSH: 2.01 u[IU]/mL (ref 0.35–4.50)

## 2014-11-09 LAB — LIPID PANEL
CHOLESTEROL: 197 mg/dL (ref 0–200)
HDL: 32.3 mg/dL — ABNORMAL LOW (ref >39.00)
LDL Cholesterol: 132 mg/dL — ABNORMAL HIGH (ref 0–99)
NONHDL: 164.57
Total CHOL/HDL Ratio: 6
Triglycerides: 164 mg/dL — ABNORMAL HIGH (ref 0.0–149.0)
VLDL: 32.8 mg/dL (ref 0.0–40.0)

## 2014-11-09 LAB — PSA: PSA: 2.44 ng/mL (ref 0.10–4.00)

## 2014-11-15 ENCOUNTER — Ambulatory Visit (INDEPENDENT_AMBULATORY_CARE_PROVIDER_SITE_OTHER): Payer: Managed Care, Other (non HMO) | Admitting: Internal Medicine

## 2014-11-15 ENCOUNTER — Encounter: Payer: Self-pay | Admitting: Internal Medicine

## 2014-11-15 VITALS — BP 126/88 | HR 76 | Temp 98.4°F | Wt 197.0 lb

## 2014-11-15 DIAGNOSIS — Z0000 Encounter for general adult medical examination without abnormal findings: Secondary | ICD-10-CM | POA: Diagnosis not present

## 2014-11-15 DIAGNOSIS — Z23 Encounter for immunization: Secondary | ICD-10-CM

## 2014-11-15 DIAGNOSIS — E7800 Pure hypercholesterolemia, unspecified: Secondary | ICD-10-CM

## 2014-11-15 MED ORDER — ROSUVASTATIN CALCIUM 10 MG PO TABS: 10.0000 mg | ORAL_TABLET | Freq: Every day | ORAL | Status: DC

## 2014-11-15 NOTE — Progress Notes (Signed)
Subjective:    Patient ID: Tyler Murphy, male    DOB: 1960-12-09, 54 y.o.   MRN: [id number]  HPI  Here for wellness and f/u;  Overall doing ok;  Pt denies Chest pain, worsening SOB, DOE, wheezing, orthopnea, PND, worsening LE edema, palpitations, dizziness or syncope.  Pt denies neurological change such as new headache, facial or extremity weakness.  Pt denies polydipsia, polyuria, or low sugar symptoms. Pt states overall good compliance with treatment and medications, good tolerability, and has been trying to follow appropriate diet.  Pt denies worsening depressive symptoms, suicidal ideation or panic. No fever, night sweats, wt loss, loss of appetite, or other constitutional symptoms.  Pt states good ability with ADL's, has low fall risk, home safety reviewed and adequate, no other significant changes in hearing or vision, and occasionally active with exercise., usually at work often 8k to 10k steps per day  Has not taken statin lately but willing to re-start Wt Readings from Last 3 Encounters:  11/15/14 197 lb (89.359 kg)  03/20/14 203 lb 9.6 oz (92.352 kg)  11/10/13 201 lb (91.173 kg)   Past Medical History  Diagnosis Date  . ALLERGIC RHINITIS 09/21/2007  . ALLERGY 11/26/2006  . BACK PAIN, LUMBAR, CHRONIC 11/26/2006  . GERD 09/21/2007  . HYPERCHOLESTEROLEMIA 11/26/2006  . 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 OBESITY, MILD 11/26/2006   Past Surgical History  Procedure Laterality Date  . Tonsillectomy    . S/p lithotrypsy      reports that he has never smoked. He does not have any smokeless tobacco history on file. He reports that he does not drink alcohol or use illicit drugs. family history is not on file. He was adopted. Allergies  Allergen Reactions  . Bee Venom Hives and Itching  . Lipitor [Atorvastatin] Other (See Comments)    myalgia  . Lovastatin     myalgia   Current Outpatient Prescriptions on File Prior to Visit  Medication Sig Dispense Refill  . aspirin 81 MG tablet Take  1 tablet (81 mg total) by mouth daily. 30 tablet 11  . cyclobenzaprine (FLEXERIL) 10 MG tablet Take 1 tablet (10 mg total) by mouth at bedtime. 10 tablet 0  . meloxicam (MOBIC) 15 MG tablet Take 1 tablet (15 mg total) by mouth daily. 14 tablet 0  . rosuvastatin (CRESTOR) 10 MG tablet Take 1 tablet (10 mg total) by mouth daily. 90 tablet 3   No current facility-administered medications on file prior to visit.     Review of Systems Constitutional: Negative for increased diaphoresis, other activity, appetite or siginficant weight change other than noted HENT: Negative for worsening hearing loss, ear pain, facial swelling, mouth sores and neck stiffness.   Eyes: Negative for other worsening pain, redness or visual disturbance.  Respiratory: Negative for shortness of breath and wheezing  Cardiovascular: Negative for chest pain and palpitations.  Gastrointestinal: Negative for diarrhea, blood in stool, abdominal distention or other pain Genitourinary: Negative for hematuria, flank pain or change in urine volume.  Musculoskeletal: Negative for myalgias or other joint complaints.  Skin: Negative for color change and wound or drainage.  Neurological: Negative for syncope and numbness. other than noted Hematological: Negative for adenopathy. or other swelling Psychiatric/Behavioral: Negative for hallucinations, SI, self-injury, decreased concentration or other worsening agitation.      Objective:   Physical Exam BP 126/88 mmHg  Pulse 76  Temp(Src) 98.4 F (36.9 C)  Wt 197 lb (89.359 kg)  SpO2 99% VS noted,  Constitutional:  Pt is oriented to person, place, and time. Appears well-developed and well-nourished, in no significant distress Head: Normocephalic and atraumatic.  Right Ear: External ear normal.  Left Ear: External ear normal.  Nose: Nose normal.  Mouth/Throat: Oropharynx is clear and moist.  Eyes: Conjunctivae and EOM are normal. Pupils are equal, round, and reactive to light.    Neck: Normal range of motion. Neck supple. No JVD present. No tracheal deviation present or significant neck LA or mass Cardiovascular: Normal rate, regular rhythm, normal heart sounds and intact distal pulses.   Pulmonary/Chest: Effort normal and breath sounds without rales or wheezing  Abdominal: Soft. Bowel sounds are normal. NT. No HSM  Musculoskeletal: Normal range of motion. Exhibits no edema.  Lymphadenopathy:  Has no cervical adenopathy.  Neurological: Pt is alert and oriented to person, place, and time. Pt has normal reflexes. No cranial nerve deficit. Motor grossly intact Skin: Skin is warm and dry. No rash noted.  Psychiatric:  Has normal mood and affect. Behavior is normal.     Assessment & Plan:

## 2014-11-15 NOTE — Assessment & Plan Note (Signed)

## 2014-11-15 NOTE — Progress Notes (Signed)
Pre visit review using our clinic review tool, if applicable. No additional management support is needed unless otherwise documented below in the visit note. 

## 2014-11-15 NOTE — Assessment & Plan Note (Signed)
To re-start crestor, f/u lab next visit Lab Results  Component Value Date   LDLCALC 132* 11/09/2014  goal < 100

## 2014-11-15 NOTE — Addendum Note (Signed)
Addended by: Ander Slade on: 11/15/2014 10:02 AM   Modules accepted: Orders

## 2014-11-15 NOTE — Patient Instructions (Addendum)
You had the flu shot today  Your EKG was OK today  Please continue all other medications as before, and refills have been done if requested.  Please have the pharmacy call with any other refills you may need.  Please continue your efforts at being more active, low cholesterol diet, and weight control.  You are otherwise up to date with prevention measures today.  Please keep your appointments with your specialists as you may have planned  Please return in 1 year for your yearly visit, or sooner if needed, with Lab testing done 3-5 days before  

## 2015-11-15 ENCOUNTER — Other Ambulatory Visit (INDEPENDENT_AMBULATORY_CARE_PROVIDER_SITE_OTHER): Payer: Managed Care, Other (non HMO)

## 2015-11-15 DIAGNOSIS — Z0000 Encounter for general adult medical examination without abnormal findings: Secondary | ICD-10-CM

## 2015-11-15 LAB — LIPID PANEL
CHOL/HDL RATIO: 5
Cholesterol: 196 mg/dL (ref 0–200)
HDL: 36.8 mg/dL — AB (ref >39.00)
LDL Cholesterol: 133 mg/dL — ABNORMAL HIGH (ref 0–99)
NONHDL: 158.97
Triglycerides: 130 mg/dL (ref 0.0–149.0)
VLDL: 26 mg/dL (ref 0.0–40.0)

## 2015-11-15 LAB — URINALYSIS, ROUTINE W REFLEX MICROSCOPIC
Bilirubin Urine: NEGATIVE
HGB URINE DIPSTICK: NEGATIVE
Ketones, ur: NEGATIVE
Leukocytes, UA: NEGATIVE
NITRITE: NEGATIVE
RBC / HPF: NONE SEEN (ref 0–?)
Specific Gravity, Urine: 1.025 (ref 1.000–1.030)
TOTAL PROTEIN, URINE-UPE24: NEGATIVE
Urine Glucose: NEGATIVE
Urobilinogen, UA: 0.2 (ref 0.0–1.0)
pH: 6 (ref 5.0–8.0)

## 2015-11-15 LAB — BASIC METABOLIC PANEL
BUN: 21 mg/dL (ref 6–23)
CALCIUM: 9.2 mg/dL (ref 8.4–10.5)
CO2: 31 meq/L (ref 19–32)
CREATININE: 1.22 mg/dL (ref 0.40–1.50)
Chloride: 104 mEq/L (ref 96–112)
GFR: 65.22 mL/min (ref >60.00)
Glucose, Bld: 78 mg/dL (ref 70–99)
Potassium: 4.6 mEq/L (ref 3.5–5.1)
SODIUM: 142 meq/L (ref 135–145)

## 2015-11-15 LAB — CBC WITH DIFFERENTIAL/PLATELET
BASOS PCT: 0.4 % (ref 0.0–3.0)
Basophils Absolute: 0 10*3/uL (ref 0.0–0.1)
EOS PCT: 2 % (ref 0.0–5.0)
Eosinophils Absolute: 0.1 10*3/uL (ref 0.0–0.7)
HCT: 48.4 % (ref 39.0–52.0)
HEMOGLOBIN: 16.6 g/dL (ref 13.0–17.0)
LYMPHS ABS: 1.4 10*3/uL (ref 0.7–4.0)
Lymphocytes Relative: 27.1 % (ref 12.0–46.0)
MCHC: 34.4 g/dL (ref 30.0–36.0)
MCV: 88.7 fl (ref 78.0–100.0)
MONO ABS: 0.4 10*3/uL (ref 0.1–1.0)
Monocytes Relative: 8 % (ref 3.0–12.0)
Neutro Abs: 3.3 10*3/uL (ref 1.4–7.7)
Neutrophils Relative %: 62.5 % (ref 43.0–77.0)
Platelets: 173 10*3/uL (ref 150.0–400.0)
RBC: 5.46 Mil/uL (ref 4.22–5.81)
RDW: 12.6 % (ref 11.5–15.5)
WBC: 5.3 10*3/uL (ref 4.0–10.5)

## 2015-11-15 LAB — TSH: TSH: 1.56 u[IU]/mL (ref 0.35–4.50)

## 2015-11-15 LAB — HEPATIC FUNCTION PANEL
ALK PHOS: 60 U/L (ref 39–117)
ALT: 27 U/L (ref 0–53)
AST: 16 U/L (ref 0–37)
Albumin: 4.3 g/dL (ref 3.5–5.2)
BILIRUBIN DIRECT: 0.1 mg/dL (ref 0.0–0.3)
BILIRUBIN TOTAL: 0.7 mg/dL (ref 0.2–1.2)
TOTAL PROTEIN: 6.7 g/dL (ref 6.0–8.3)

## 2015-11-15 LAB — PSA: PSA: 3.09 ng/mL (ref 0.10–4.00)

## 2015-11-15 LAB — HEPATITIS C ANTIBODY: HCV AB: NEGATIVE

## 2015-11-21 ENCOUNTER — Ambulatory Visit (INDEPENDENT_AMBULATORY_CARE_PROVIDER_SITE_OTHER): Payer: Managed Care, Other (non HMO) | Admitting: Internal Medicine

## 2015-11-21 ENCOUNTER — Encounter: Payer: Self-pay | Admitting: Internal Medicine

## 2015-11-21 VITALS — BP 128/82 | HR 80 | Temp 98.0°F | Resp 20 | Wt 200.5 lb

## 2015-11-21 DIAGNOSIS — R972 Elevated prostate specific antigen [PSA]: Secondary | ICD-10-CM

## 2015-11-21 DIAGNOSIS — Z0001 Encounter for general adult medical examination with abnormal findings: Secondary | ICD-10-CM | POA: Diagnosis not present

## 2015-11-21 DIAGNOSIS — E7800 Pure hypercholesterolemia, unspecified: Secondary | ICD-10-CM

## 2015-11-21 MED ORDER — ROSUVASTATIN CALCIUM 10 MG PO TABS: 10.0000 mg | ORAL_TABLET | Freq: Every day | ORAL | 3 refills | Status: DC

## 2015-11-21 NOTE — Progress Notes (Signed)
Pre visit review using our clinic review tool, if applicable. No additional management support is needed unless otherwise documented below in the visit note. 

## 2015-11-21 NOTE — Patient Instructions (Addendum)
Please continue all other medications as before, and refills have been done if requested.  Please have the pharmacy call with any other refills you may need.  Please continue your efforts at being more active, low cholesterol diet, and weight control.  You are otherwise up to date with prevention measures today.  Please keep your appointments with your specialists as you may have planned  Please return for LAB only in 6 months for PSA repeat  Please return in 1 year for your yearly visit, or sooner if needed, with Lab testing done 3-5 days before

## 2015-11-21 NOTE — Progress Notes (Signed)
Subjective:    Patient ID: Tyler Murphy, male    DOB: [birth year], 55 y.o.   MRN: [id number]  HPI  Here for wellness and f/u;  Overall doing ok;  Pt denies Chest pain, worsening SOB, DOE, wheezing, orthopnea, PND, worsening LE edema, palpitations, dizziness or syncope.  Pt denies neurological change such as new headache, facial or extremity weakness.  Pt denies polydipsia, polyuria, or low sugar symptoms. Pt states overall good compliance with treatment and medications, good tolerability, and has been trying to follow appropriate diet.  Pt denies worsening depressive symptoms, suicidal ideation or panic. No fever, night sweats, wt loss, loss of appetite, or other constitutional symptoms.  Pt states good ability with ADL's, has low fall risk, home safety reviewed and adequate, no other significant changes in hearing or vision, and only occasionally active with exercise. Has new glasses/bifocals and new bilat hearing aids.   Did see [place] [facility] last wk with mild arthritis only, for pain med prn only.  Has flu shot scheduled at work.   Past Medical History:  Diagnosis Date  . ALLERGIC RHINITIS 09/21/2007  . ALLERGY 11/26/2006  . BACK PAIN, LUMBAR, CHRONIC 11/26/2006  . GERD 09/21/2007  . HYPERCHOLESTEROLEMIA 11/26/2006  . 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 OBESITY, MILD 11/26/2006   Past Surgical History:  Procedure Laterality Date  . s/p lithotrypsy    . TONSILLECTOMY      reports that he has never smoked. He does not have any smokeless tobacco history on file. He reports that he does not drink alcohol or use drugs. family history is not on file. He was adopted. Allergies  Allergen Reactions  . Bee Venom Hives and Itching  . Lipitor [Atorvastatin] Other (See Comments)    myalgia  . Lovastatin     myalgia   Current Outpatient Prescriptions on File Prior to Visit  Medication Sig Dispense Refill  . aspirin 81 MG tablet Take 1 tablet (81 mg total) by mouth daily. 30 tablet 11   No current  facility-administered medications on file prior to visit.    Review of Systems Constitutional: Negative for increased diaphoresis, or other activity, appetite or siginficant weight change other than noted HENT: Negative for worsening hearing loss, ear pain, facial swelling, mouth sores and neck stiffness.   Eyes: Negative for other worsening pain, redness or visual disturbance.  Respiratory: Negative for choking or stridor Cardiovascular: Negative for other chest pain and palpitations.  Gastrointestinal: Negative for worsening diarrhea, blood in stool, or abdominal distention Genitourinary: Negative for hematuria, flank pain or change in urine volume.  Musculoskeletal: Negative for myalgias or other joint complaints.  Skin: Negative for other color change and wound or drainage.  Neurological: Negative for syncope and numbness. other than noted Hematological: Negative for adenopathy. or other swelling Psychiatric/Behavioral: Negative for hallucinations, SI, self-injury, decreased concentration or other worsening agitation.      Objective:   Physical Exam BP 128/82   Pulse 80   Temp 98 F (36.7 C) (Oral)   Resp 20   Wt 200 lb 8 oz (90.9 kg)   SpO2 98%   BMI 29.18 kg/m  VS noted,  Constitutional: Pt is oriented to person, place, and time. Appears well-developed and well-nourished, in no significant distress Head: Normocephalic and atraumatic  Eyes: Conjunctivae and EOM are normal. Pupils are equal, round, and reactive to light Right Ear: External ear normal.  Left Ear: External ear normal Nose: Nose normal.  Mouth/Throat: Oropharynx is clear and moist  Neck:  Normal range of motion. Neck supple. No JVD present. No tracheal deviation present or significant neck LA or mass Cardiovascular: Normal rate, regular rhythm, normal heart sounds and intact distal pulses.   Pulmonary/Chest: Effort normal and breath sounds without rales or wheezing  Abdominal: Soft. Bowel sounds are normal. NT.  No HSM  Musculoskeletal: Normal range of motion. Exhibits no edema Lymphadenopathy: Has no cervical adenopathy.  Neurological: Pt is alert and oriented to person, place, and time. Pt has normal reflexes. No cranial nerve deficit. Motor grossly intact Skin: Skin is warm and dry. No rash noted or new ulcers Psychiatric:  Has normal mood and affect. Behavior is normal.      Assessment & Plan:

## 2015-11-26 NOTE — Assessment & Plan Note (Signed)

## 2015-11-26 NOTE — Assessment & Plan Note (Signed)
stable overall by history and exam, recent data reviewed with pt, and pt to continue medical treatment as before,  to f/u any worsening symptoms or concerns Lab Results  Component Value Date   LDLCALC 133 (H) 11/15/2015   To re-start crestor trial, has been intol to other statin

## 2015-11-26 NOTE — Assessment & Plan Note (Signed)
?   BPH related, declines urology referral, for PSA repeat at 6 mo,  to f/u any worsening symptoms or concerns

## 2015-12-09 ENCOUNTER — Ambulatory Visit (INDEPENDENT_AMBULATORY_CARE_PROVIDER_SITE_OTHER): Payer: Managed Care, Other (non HMO) | Admitting: Family Medicine

## 2015-12-09 VITALS — BP 114/82 | HR 104 | Temp 98.7°F | Resp 16 | Ht 69.69 in | Wt 199.0 lb

## 2015-12-09 DIAGNOSIS — J22 Unspecified acute lower respiratory infection: Secondary | ICD-10-CM | POA: Diagnosis not present

## 2015-12-09 DIAGNOSIS — R0789 Other chest pain: Secondary | ICD-10-CM | POA: Diagnosis not present

## 2015-12-09 DIAGNOSIS — R059 Cough, unspecified: Secondary | ICD-10-CM

## 2015-12-09 DIAGNOSIS — R05 Cough: Secondary | ICD-10-CM | POA: Diagnosis not present

## 2015-12-09 MED ORDER — AZITHROMYCIN 250 MG PO TABS: ORAL_TABLET | ORAL | 0 refills | Status: DC

## 2015-12-09 NOTE — Patient Instructions (Addendum)
Based on your exam, there is some congestion in the left lower side that could be an early walking pneumonia, or persistent congestion from earlier infection. Start antibiotic azithromycin, Mucinex or Mucinex DM as needed for cough or the Robitussin-DM that she has been taking, and if any fevers, shortness breath, or any worsening symptoms into next week, return for x-ray and blood work.  Your chest wall pain is likely due to the cough. Advil or Aleve over-the-counter as needed, heat or ice to the affected area, and recheck if this is not improving in the next week or 2.  Return to the clinic or go to the nearest emergency room if any of your symptoms worsen or new symptoms occur.   Chest Wall Pain Chest wall pain is pain in or around the bones and muscles of your chest. Sometimes, an injury causes this pain. Sometimes, the cause may not be known. This pain may take several weeks or longer to get better. HOME CARE INSTRUCTIONS  Pay attention to any changes in your symptoms. Take these actions to help with your pain:   Rest as told by your health care provider.   Avoid activities that cause pain. These include any activities that use your chest muscles or your abdominal and side muscles to lift heavy items.   If directed, apply ice to the painful area:  Put ice in a plastic bag.  Place a towel between your skin and the bag.  Leave the ice on for 20 minutes, 2-3 times per day.  Take over-the-counter and prescription medicines only as told by your health care provider.  Do not use tobacco products, including cigarettes, chewing tobacco, and e-cigarettes. If you need help quitting, ask your health care provider.  Keep all follow-up visits as told by your health care provider. This is important. SEEK MEDICAL CARE IF:  You have a fever.  Your chest pain becomes worse.  You have new symptoms. SEEK IMMEDIATE MEDICAL CARE IF:  You have nausea or vomiting.  You feel sweaty or  light-headed.  You have a cough with phlegm (sputum) or you cough up blood.  You develop shortness of breath.   This information is not intended to replace advice given to you by your health care provider. Make sure you discuss any questions you have with your health care provider.   Document Released: 02/04/2005 Document Revised: 10/26/2014 Document Reviewed: 05/02/2014 Elsevier Interactive Patient Education 2016 [employer]  Cough, Adult Coughing is a reflex that clears your throat and your airways. Coughing helps to heal and protect your lungs. It is normal to cough occasionally, but a cough that happens with other symptoms or lasts a long time may be a sign of a condition that needs treatment. A cough may last only 2-3 weeks (acute), or it may last longer than 8 weeks (chronic). CAUSES Coughing is commonly caused by:  Breathing in substances that irritate your lungs.  A viral or bacterial respiratory infection.  Allergies.  Asthma.  Postnasal drip.  Smoking.  Acid backing up from the stomach into the esophagus (gastroesophageal reflux).  Certain medicines.  Chronic lung problems, including COPD (or rarely, lung cancer).  Other medical conditions such as heart failure. HOME CARE INSTRUCTIONS  Pay attention to any changes in your symptoms. Take these actions to help with your discomfort:  Take medicines only as told by your health care provider.  If you were prescribed an antibiotic medicine, take it as told by your health care provider. Do not  stop taking the antibiotic even if you start to feel better.  Talk with your health care provider before you take a cough suppressant medicine.  Drink enough fluid to keep your urine clear or pale yellow.  If the air is dry, use a cold steam vaporizer or humidifier in your bedroom or your home to help loosen secretions.  Avoid anything that causes you to cough at work or at home.  If your cough is worse at night, try  sleeping in a semi-upright position.  Avoid cigarette smoke. If you smoke, quit smoking. If you need help quitting, ask your health care provider.  Avoid caffeine.  Avoid alcohol.  Rest as needed. SEEK MEDICAL CARE IF:   You have new symptoms.  You cough up pus.  Your cough does not get better after 2-3 weeks, or your cough gets worse.  You cannot control your cough with suppressant medicines and you are losing sleep.  You develop pain that is getting worse or pain that is not controlled with pain medicines.  You have a fever.  You have unexplained weight loss.  You have night sweats. SEEK IMMEDIATE MEDICAL CARE IF:  You cough up blood.  You have difficulty breathing.  Your heartbeat is very fast.   This information is not intended to replace advice given to you by your health care provider. Make sure you discuss any questions you have with your health care provider.   Document Released: 08/03/2010 Document Revised: 10/26/2014 Document Reviewed: 04/13/2014 Elsevier Interactive Patient Education 2016 [employer].   IF you received an x-ray today, you will receive an invoice from [facility]. Please contact [facility] at [phone] with questions or concerns regarding your invoice.   IF you received labwork today, you will receive an invoice from [employer]. Please contact [facility] at [phone] with questions or concerns regarding your invoice.   Our billing staff will not be able to assist you with questions regarding bills from these companies.  You will be contacted with the lab results as soon as they are available. The fastest way to get your results is to activate your My Chart account. Instructions are located on the last page of this paperwork. If you have not heard from [place] regarding the results in 2 weeks, please contact this office.

## 2015-12-09 NOTE — Progress Notes (Signed)
Subjective:  By signing my name below, I, Moises Blood, attest that this documentation has been prepared under the direction and in the presence of [name], MD. Electronically Signed: Moises Blood, Kendrick. 12/09/2015 , 10:08 AM .  Patient was seen in Room 7 .   Patient ID: Tyler Murphy, male    DOB: 1960/11/10, 55 y.o.   MRN: [id number] Chief Complaint  Patient presents with  . Cough    x 1 week. leading to rib pain   HPI Tyler Murphy is a 55 y.o. male H/o chronic kidney disease, HLD, GERD and allergic rhinitis. Here with cough and rib pain.   Patient states he's been having a "nagging, persistent" cough with some chills today for a week now, which has been causing discomfort over his left ribs. He's taken robitussin-dm which helped loosened his chest congestion, as well as taken some cough drops. He mentions left rib cage soreness starting over the last 3 days, aggravates with deep breathing. He denies leg swelling, leg pain, chest pain, shortness of breath, wheezing or fever. He denies history of asthma or other lung problems. He had a CPE done last month. He updated his flu shot a month ago.   Patient Active Problem List   Diagnosis Date Noted  . CKD (chronic kidney disease) stage 3, GFR 30-59 ml/min 11/10/2013  . Increased prostate specific antigen (PSA) velocity 11/10/2013  . Nocturia 11/05/2012  . 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 Encounter for long-term (current) use of high-risk medication 11/01/2010  . Encounter for well adult exam with abnormal findings 10/26/2010  . ALLERGIC RHINITIS 09/21/2007  . GERD 09/21/2007  . 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 HYPERCHOLESTEROLEMIA 11/26/2006  . BACK PAIN, LUMBAR, CHRONIC 11/26/2006   Past Medical History:  Diagnosis Date  . ALLERGIC RHINITIS 09/21/2007  . ALLERGY 11/26/2006  . BACK PAIN, LUMBAR, CHRONIC 11/26/2006  . GERD 09/21/2007  . HYPERCHOLESTEROLEMIA 11/26/2006  . NEPHROLITHIASIS, HX OF 09/21/2007   . OBESITY, MILD 11/26/2006   Past Surgical History:  Procedure Laterality Date  . s/p lithotrypsy    . TONSILLECTOMY     Allergies  Allergen Reactions  . Bee Venom Hives and Itching  . Lipitor [Atorvastatin] Other (See Comments)    myalgia  . Lovastatin     myalgia   Prior to Admission medications   Medication Sig Start Date End Date Taking? Authorizing Provider  aspirin 81 MG tablet Take 1 tablet (81 mg total) by mouth daily. 11/10/13  Yes Biagio Borg, MD  rosuvastatin (CRESTOR) 10 MG tablet Take 1 tablet (10 mg total) by mouth daily. 11/21/15  Yes Biagio Borg, MD   Social History   Social History  . Marital status: Married    Spouse name: N/A  . Number of children: 3  . Years of education: N/A   Occupational History  . 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 Smoking status: Never Smoker  . Smokeless tobacco: Not on file  . Alcohol use No  . Drug use: No  . Sexual activity: Yes   Other Topics Concern  . Not on file   Social History Narrative  . No narrative on file   Review of Systems  Constitutional: Positive for chills. Negative for activity change, fatigue and fever.  Respiratory: Positive for cough. Negative for chest tightness, shortness of breath and wheezing.   Cardiovascular: Negative for chest  pain and leg swelling.       Chest wall discomfort  Gastrointestinal: Negative for diarrhea, nausea and vomiting.       Objective:   Physical Exam  Constitutional: He is oriented to person, place, and time. He appears well-developed and well-nourished.  HENT:  Head: Normocephalic and atraumatic.  Right Ear: Tympanic membrane, external ear and ear canal normal.  Left Ear: Tympanic membrane, external ear and ear canal normal.  Nose: No rhinorrhea.  Mouth/Throat: Oropharynx is clear and moist and mucous membranes are normal. No oropharyngeal exudate or posterior  oropharyngeal erythema.  Eyes: Conjunctivae are normal. Pupils are equal, round, and reactive to light.  Neck: Neck supple.  Cardiovascular: Normal rate, regular rhythm, normal heart sounds and intact distal pulses.   No murmur heard. Pulmonary/Chest: Effort normal. He has no wheezes. He has rhonchi (faint) in the left lower field. He has no rales.  Reproducible pain over left anterior lower chest wall with palpation  Abdominal: Soft. There is no tenderness.  Lymphadenopathy:    He has no cervical adenopathy.  Neurological: He is alert and oriented to person, place, and time.  Skin: Skin is warm and dry. No rash noted.  Psychiatric: He has a normal mood and affect. His behavior is normal.  Vitals reviewed.   Vitals:   12/09/15 0929  BP: 114/82  Pulse: (!) 104  Resp: 16  Temp: 98.7 F (37.1 C)  SpO2: 95%  Weight: 199 lb (90.3 kg)  Height: 5' 9.69" (1.77 m)      Assessment & Plan:    Tyler Murphy is a 55 y.o. male Cough - Plan: azithromycin (ZITHROMAX) 250 MG tablet  LRTI (lower respiratory tract infection) - Plan: azithromycin (ZITHROMAX) 250 MG tablet  Chest wall pain - Plan: azithromycin (ZITHROMAX) 250 MG tablet   Possible early community acquired pneumonia with rhonchi/coarse breath sounds left lower lobe. However he is afebrile, reassuring vital signs. Decided against imaging or blood work today. Left-sided chest wall pain likely due to cough and intercostal muscle pain as reproducible on exam.  -Start Z-Pak, Mucinex or Mucinex DM over-the-counter for cough, Advil or Aleve if needed short-term for chest wall pain and handouts given. RTC precautions if not improving with antibiotic, or if any worsening of symptoms sooner. Meds ordered this encounter  Medications  . azithromycin (ZITHROMAX) 250 MG tablet    Sig: Take 2 pills by mouth on day 1, then 1 pill by mouth per day on days 2 through 5.    Dispense:  6 tablet    Refill:  0   Patient Instructions    Based  on your exam, there is some congestion in the left lower side that could be an early walking pneumonia, or persistent congestion from earlier infection. Start antibiotic azithromycin, Mucinex or Mucinex DM as needed for cough or the Robitussin-DM that she has been taking, and if any fevers, shortness breath, or any worsening symptoms into next week, return for x-ray and blood work.  Your chest wall pain is likely due to the cough. Advil or Aleve over-the-counter as needed, heat or ice to the affected area, and recheck if this is not improving in the next week or 2.  Return to the clinic or go to the nearest emergency room if any of your symptoms worsen or new symptoms occur.   Chest Wall Pain Chest wall pain is pain in or around the bones and muscles of your chest. Sometimes, an injury causes this pain. Sometimes, the  cause may not be known. This pain may take several weeks or longer to get better. HOME CARE INSTRUCTIONS  Pay attention to any changes in your symptoms. Take these actions to help with your pain:   Rest as told by your health care provider.   Avoid activities that cause pain. These include any activities that use your chest muscles or your abdominal and side muscles to lift heavy items.   If directed, apply ice to the painful area:  Put ice in a plastic bag.  Place a towel between your skin and the bag.  Leave the ice on for 20 minutes, 2-3 times per day.  Take over-the-counter and prescription medicines only as told by your health care provider.  Do not use tobacco products, including cigarettes, chewing tobacco, and e-cigarettes. If you need help quitting, ask your health care provider.  Keep all follow-up visits as told by your health care provider. This is important. SEEK MEDICAL CARE IF:  You have a fever.  Your chest pain becomes worse.  You have new symptoms. SEEK IMMEDIATE MEDICAL CARE IF:  You have nausea or vomiting.  You feel sweaty or  light-headed.  You have a cough with phlegm (sputum) or you cough up blood.  You develop shortness of breath.   This information is not intended to replace advice given to you by your health care provider. Make sure you discuss any questions you have with your health care provider.   Document Released: 02/04/2005 Document Revised: 10/26/2014 Document Reviewed: 05/02/2014 Elsevier Interactive Patient Education 2016 Elsevier Inc.  Cough, Adult Coughing is a reflex that clears your throat and your airways. Coughing helps to heal and protect your lungs. It is normal to cough occasionally, but a cough that happens with other symptoms or lasts a long time may be a sign of a condition that needs treatment. A cough may last only 2-3 weeks (acute), or it may last longer than 8 weeks (chronic). CAUSES Coughing is commonly caused by:  Breathing in substances that irritate your lungs.  A viral or bacterial respiratory infection.  Allergies.  Asthma.  Postnasal drip.  Smoking.  Acid backing up from the stomach into the esophagus (gastroesophageal reflux).  Certain medicines.  Chronic lung problems, including COPD (or rarely, lung cancer).  Other medical conditions such as heart failure. HOME CARE INSTRUCTIONS  Pay attention to any changes in your symptoms. Take these actions to help with your discomfort:  Take medicines only as told by your health care provider.  If you were prescribed an antibiotic medicine, take it as told by your health care provider. Do not stop taking the antibiotic even if you start to feel better.  Talk with your health care provider before you take a cough suppressant medicine.  Drink enough fluid to keep your urine clear or pale yellow.  If the air is dry, use a cold steam vaporizer or humidifier in your bedroom or your home to help loosen secretions.  Avoid anything that causes you to cough at work or at home.  If your cough is worse at night, try  sleeping in a semi-upright position.  Avoid cigarette smoke. If you smoke, quit smoking. If you need help quitting, ask your health care provider.  Avoid caffeine.  Avoid alcohol.  Rest as needed. SEEK MEDICAL CARE IF:   You have new symptoms.  You cough up pus.  Your cough does not get better after 2-3 weeks, or your cough gets worse.  You cannot control  your cough with suppressant medicines and you are losing sleep.  You develop pain that is getting worse or pain that is not controlled with pain medicines.  You have a fever.  You have unexplained weight loss.  You have night sweats. SEEK IMMEDIATE MEDICAL CARE IF:  You cough up blood.  You have difficulty breathing.  Your heartbeat is very fast.   This information is not intended to replace advice given to you by your health care provider. Make sure you discuss any questions you have with your health care provider.   Document Released: 08/03/2010 Document Revised: 10/26/2014 Document Reviewed: 04/13/2014 Elsevier Interactive Patient Education 2016 [employer].   IF you received an x-ray today, you will receive an invoice from [facility]. Please contact [facility] at [phone] with questions or concerns regarding your invoice.   IF you received labwork today, you will receive an invoice from [employer]. Please contact [facility] at [phone] with questions or concerns regarding your invoice.   Our billing staff will not be able to assist you with questions regarding bills from these companies.  You will be contacted with the lab results as soon as they are available. The fastest way to get your results is to activate your My Chart account. Instructions are located on the last page of this paperwork. If you have not heard from [place] regarding the results in 2 weeks, please contact this office.        I personally performed the services described in this  documentation, which was scribed in my presence. The recorded information has been reviewed and considered, and addended by me as needed.   Signed,   [name], MD [facility].  12/09/15 12:24 PM

## 2016-02-20 ENCOUNTER — Ambulatory Visit (INDEPENDENT_AMBULATORY_CARE_PROVIDER_SITE_OTHER): Payer: Managed Care, Other (non HMO) | Admitting: Physician Assistant

## 2016-02-20 VITALS — BP 126/82 | HR 110 | Temp 99.4°F | Resp 18 | Ht 69.69 in | Wt 203.0 lb

## 2016-02-20 DIAGNOSIS — L989 Disorder of the skin and subcutaneous tissue, unspecified: Secondary | ICD-10-CM | POA: Diagnosis not present

## 2016-02-20 DIAGNOSIS — J329 Chronic sinusitis, unspecified: Secondary | ICD-10-CM | POA: Diagnosis not present

## 2016-02-20 MED ORDER — IPRATROPIUM BROMIDE 0.03 % NA SOLN: 2.0000 | Freq: Two times a day (BID) | NASAL | 0 refills | Status: DC

## 2016-02-20 MED ORDER — GUAIFENESIN ER 1200 MG PO TB12: 1.0000 | ORAL_TABLET | Freq: Two times a day (BID) | ORAL | 1 refills | Status: DC | PRN

## 2016-02-20 NOTE — Patient Instructions (Addendum)
Please hydrate well with 64 oz of water per day Take medication as prescribed.   Sinusitis, Adult Sinusitis is soreness and inflammation of your sinuses. Sinuses are hollow spaces in the bones around your face. They are located:  Around your eyes.  In the middle of your forehead.  Behind your nose.  In your cheekbones. Your sinuses and nasal passages are lined with a stringy fluid (mucus). Mucus normally drains out of your sinuses. When your nasal tissues get inflamed or swollen, the mucus can get trapped or blocked so air cannot flow through your sinuses. This lets bacteria, viruses, and funguses grow, and that leads to infection. Follow these instructions at home: Medicines  Take, use, or apply over-the-counter and prescription medicines only as told by your doctor. These may include nasal sprays.  If you were prescribed an antibiotic medicine, take it as told by your doctor. Do not stop taking the antibiotic even if you start to feel better. Hydrate and Humidify  Drink enough water to keep your pee (urine) clear or pale yellow.  Use a cool mist humidifier to keep the humidity level in your home above 50%.  Breathe in steam for 10-15 minutes, 3-4 times a day or as told by your doctor. You can do this in the bathroom while a hot shower is running.  Try not to spend time in cool or dry air. Rest  Rest as much as possible.  Sleep with your head raised (elevated).  Make sure to get enough sleep each night. General instructions  Put a warm, moist washcloth on your face 3-4 times a day or as told by your doctor. This will help with discomfort.  Wash your hands often with soap and water. If there is no soap and water, use hand sanitizer.  Do not smoke. Avoid being around people who are smoking (secondhand smoke).  Keep all follow-up visits as told by your doctor. This is important. Contact a doctor if:  You have a fever.  Your symptoms get worse.  Your symptoms do not  get better within 10 days. Get help right away if:  You have a very bad headache.  You cannot stop throwing up (vomiting).  You have pain or swelling around your face or eyes.  You have trouble seeing.  You feel confused.  Your neck is stiff.  You have trouble breathing. This information is not intended to replace advice given to you by your health care provider. Make sure you discuss any questions you have with your health care provider. Document Released: 07/24/2007 Document Revised: 10/01/2015 Document Reviewed: 11/30/2014 Elsevier Interactive Patient Education  2017 [employer].    IF you received an x-ray today, you will receive an invoice from [facility]. Please contact [facility] at [phone] with questions or concerns regarding your invoice.   IF you received labwork today, you will receive an invoice from [facility]. Please contact [facility] at [phone] with questions or concerns regarding your invoice.   Our billing staff will not be able to assist you with questions regarding bills from these companies.  You will be contacted with the lab results as soon as they are available. The fastest way to get your results is to activate your My Chart account. Instructions are located on the last page of this paperwork. If you have not heard from [place] regarding the results in 2 weeks, please contact this office.

## 2016-02-20 NOTE — Progress Notes (Signed)
[facility] [address], [place] [address] [phone]  Date:  02/20/2016   Name:  Tyler Murphy   DOB:  Nov 09, 1960   MRN:  [id number]  PCP:  Cathlean Cower, MD    History of Present Illness:  Tyler Murphy is a 56 y.o. male patient who presents to [facility] for cc of cough and congestion. Low grade fever, and chills, and joint aches for 3 days.  Coughing and nasal congestion with yellow mucus.  Cough is not worse at night.  No sob or dyspnea.  He is taking alka-seltzer plus to no avail--daytime and nighttime.  No ear pain.  No sore throat.  He has no known sick contacts.      Patient also has concern of lesion on left side of neck.  It appears to be an unhealing lesion and at times would bleed.  He does not recall the trauma or known origin of the lesion.  Not a great deal of sun exposure.  No hx of skin lesions.  Chief Complaint  Patient presents with  . Cough  . URI     Patient Active Problem List   Diagnosis Date Noted  . CKD (chronic kidney disease) stage 3, GFR 30-59 ml/min 11/10/2013  . Increased prostate specific antigen (PSA) velocity 11/10/2013  . Nocturia 11/05/2012  . 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 Encounter for long-term (current) use of high-risk medication 11/01/2010  . Encounter for well adult exam with abnormal findings 10/26/2010  . ALLERGIC RHINITIS 09/21/2007  . GERD 09/21/2007  . 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 HYPERCHOLESTEROLEMIA 11/26/2006  . BACK PAIN, LUMBAR, CHRONIC 11/26/2006    Past Medical History:  Diagnosis Date  . ALLERGIC RHINITIS 09/21/2007  . ALLERGY 11/26/2006  . BACK PAIN, LUMBAR, CHRONIC 11/26/2006  . GERD 09/21/2007  . HYPERCHOLESTEROLEMIA 11/26/2006  . 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 OBESITY, MILD 11/26/2006    Past Surgical History:  Procedure Laterality Date  . s/p lithotrypsy    . TONSILLECTOMY      Social History  Substance Use Topics  . Smoking status: Never Smoker  . Smokeless  tobacco: Never Used  . Alcohol use No    Family History  Problem Relation Age of Onset  . Adopted: Yes    Allergies  Allergen Reactions  . Bee Venom Hives and Itching  . Lipitor [Atorvastatin] Other (See Comments)    myalgia  . Lovastatin     myalgia    Medication list has been reviewed and updated.  Current Outpatient Prescriptions on File Prior to Visit  Medication Sig Dispense Refill  . aspirin 81 MG tablet Take 1 tablet (81 mg total) by mouth daily. 30 tablet 11  . rosuvastatin (CRESTOR) 10 MG tablet Take 1 tablet (10 mg total) by mouth daily. 90 tablet 3   No current facility-administered medications on file prior to visit.     ROS ROS otherwise unremarkable unless listed above.   Physical Examination: BP 126/82 (BP Location: Right Arm, Patient Position: Sitting, Cuff Size: Small)   Pulse (!) 110   Temp 99.4 F (37.4 C) (Oral)   Resp 18   Ht 5' 9.69" (1.77 m)   Wt 203 lb (92.1 kg)   SpO2 97%   BMI 29.39 kg/m  Ideal Body Weight: Weight in (lb) to have BMI = 25: 172.3  Physical Exam  Constitutional: He is oriented to person, place, and time. He appears well-developed and well-nourished. No distress.  HENT:  Head: Atraumatic.  Right Ear: Tympanic membrane, external ear and ear canal normal.  Left Ear: Tympanic membrane, external ear and ear canal normal.  Nose: Mucosal edema and rhinorrhea present. Right sinus exhibits no maxillary sinus tenderness and no frontal sinus tenderness. Left sinus exhibits no maxillary sinus tenderness and no frontal sinus tenderness.  Mouth/Throat: No uvula swelling. No oropharyngeal exudate, posterior oropharyngeal edema or posterior oropharyngeal erythema.  Eyes: Conjunctivae, EOM and lids are normal. Pupils are equal, round, and reactive to light. Right eye exhibits normal extraocular motion. Left eye exhibits normal extraocular motion.  Neck: Trachea normal and full passive range of motion without pain. No edema and no erythema  present.  Cardiovascular: Normal rate.   Pulmonary/Chest: Effort normal. No respiratory distress. He has no decreased breath sounds. He has no wheezes. He has no rhonchi.  Neurological: He is alert and oriented to person, place, and time.  Skin: Skin is warm and dry. He is not diaphoretic.  Raised 1.5 cm lesion with scabbing and non tender.  No telangectasia evident.   Psychiatric: He has a normal mood and affect. His behavior is normal.     Assessment and Plan: COSTON SHIVE is a 56 y.o. male who is here today for cc of sinus congestion, and cough.  If patient has no improvement: augmentin bid for 10 days. 20 tablets, 0 no improvement within the next 5 days, or worsening fever.   Lesion is concerning.  Referring to ortho at this time.  Possible basal cell.  Rhinosinusitis - Plan: ipratropium (ATROVENT) 0.03 % nasal spray, Guaifenesin (MUCINEX MAXIMUM STRENGTH) 1200 MG TB12  Skin lesion - Plan: Ambulatory referral to Dermatology  Ivar Drape, PA-C [facility] 1/7/20186:53 PM

## 2016-02-26 ENCOUNTER — Telehealth: Payer: Self-pay

## 2016-02-26 NOTE — Telephone Encounter (Signed)
Checking on status of Referral to Dermatologist.   Please call [phone] (H)

## 2016-02-28 NOTE — Telephone Encounter (Signed)
OV notes were not completed at time of call. Notes signed and referral sent on 1/9.

## 2016-06-28 ENCOUNTER — Ambulatory Visit (INDEPENDENT_AMBULATORY_CARE_PROVIDER_SITE_OTHER): Payer: Managed Care, Other (non HMO) | Admitting: Physician Assistant

## 2016-06-28 ENCOUNTER — Encounter: Payer: Self-pay | Admitting: Physician Assistant

## 2016-06-28 VITALS — BP 122/81 | HR 95 | Temp 99.0°F | Resp 16 | Ht 68.75 in | Wt 196.8 lb

## 2016-06-28 DIAGNOSIS — M5417 Radiculopathy, lumbosacral region: Secondary | ICD-10-CM

## 2016-06-28 MED ORDER — CYCLOBENZAPRINE HCL 10 MG PO TABS: 10.0000 mg | ORAL_TABLET | Freq: Three times a day (TID) | ORAL | 0 refills | Status: DC | PRN

## 2016-06-28 MED ORDER — PREDNISONE 20 MG PO TABS: ORAL_TABLET | ORAL | 0 refills | Status: DC

## 2016-06-28 NOTE — Patient Instructions (Addendum)
I would like to ice the back three times per day for 15 minutes.   Three stretches three times per day.  I would like you to ice the back directly after the stretches.   Sciatica Rehab Ask your health care provider which exercises are safe for you. Do exercises exactly as told by your health care provider and adjust them as directed. It is normal to feel mild stretching, pulling, tightness, or discomfort as you do these exercises, but you should stop right away if you feel sudden pain or your pain gets worse.Do not begin these exercises until told by your health care provider. Stretching and range of motion exercises These exercises warm up your muscles and joints and improve the movement and flexibility of your hips and your back. These exercises also help to relieve pain, numbness, and tingling. Exercise A: Sciatic nerve glide  1. Sit in a chair with your head facing down toward your chest. Place your hands behind your back. Let your shoulders slump forward. 2. Slowly straighten one of your knees while you tilt your head back as if you are looking toward the ceiling. Only straighten your leg as far as you can without making your symptoms worse. 3. Hold for __________ seconds. 4. Slowly return to the starting position. 5. Repeat with your other leg. Repeat __________ times. Complete this exercise __________ times a day. Exercise B: Knee to chest with hip adduction and internal rotation   1. Lie on your back on a firm surface with both legs straight. 2. Bend one of your knees and move it up toward your chest until you feel a gentle stretch in your lower back and buttock. Then, move your knee toward the shoulder that is on the opposite side from your leg.  Hold your leg in this position by holding onto the front of your knee. 3. Hold for __________ seconds. 4. Slowly return to the starting position. 5. Repeat with your other leg. Repeat __________ times. Complete this exercise __________ times  a day. Exercise C: Prone extension on elbows   1. Lie on your abdomen on a firm surface. A bed may be too soft for this exercise. 2. Prop yourself up on your elbows. 3. Use your arms to help lift your chest up until you feel a gentle stretch in your abdomen and your lower back.  This will place some of your body weight on your elbows. If this is uncomfortable, try stacking pillows under your chest.  Your hips should stay down, against the surface that you are lying on. Keep your hip and back muscles relaxed. 4. Hold for __________ seconds. 5. Slowly relax your upper body and return to the starting position. Repeat __________ times. Complete this exercise __________ times a day. Strengthening exercises These exercises build strength and endurance in your back. Endurance is the ability to use your muscles for a long time, even after they get tired. Exercise D: Pelvic tilt  1. Lie on your back on a firm surface. Bend your knees and keep your feet flat. 2. Tense your abdominal muscles. Tip your pelvis up toward the ceiling and flatten your lower back into the floor.  To help with this exercise, you may place a small towel under your lower back and try to push your back into the towel. 3. Hold for __________ seconds. 4. Let your muscles relax completely before you repeat this exercise. Repeat __________ times. Complete this exercise __________ times a day. Exercise E: Alternating arm and  leg raises   1. Get on your hands and knees on a firm surface. If you are on a hard floor, you may want to use padding to cushion your knees, such as an exercise mat. 2. Line up your arms and legs. Your hands should be below your shoulders, and your knees should be below your hips. 3. Lift your left leg behind you. At the same time, raise your right arm and straighten it in front of you.  Do not lift your leg higher than your hip.  Do not lift your arm higher than your shoulder.  Keep your abdominal and  back muscles tight.  Keep your hips facing the ground.  Do not arch your back.  Keep your balance carefully, and do not hold your breath. 4. Hold for __________ seconds. 5. Slowly return to the starting position and repeat with your right leg and your left arm. Repeat __________ times. Complete this exercise __________ times a day. Posture and body mechanics   Body mechanics refers to the movements and positions of your body while you do your daily activities. Posture is part of body mechanics. Good posture and healthy body mechanics can help to relieve stress in your body's tissues and joints. Good posture means that your spine is in its natural S-curve position (your spine is neutral), your shoulders are pulled back slightly, and your head is not tipped forward. The following are general guidelines for applying improved posture and body mechanics to your everyday activities. Standing    When standing, keep your spine neutral and your feet about hip-width apart. Keep a slight bend in your knees. Your ears, shoulders, and hips should line up.  When you do a task in which you stand in one place for a long time, place one foot up on a stable object that is 2-4 inches (5-10 cm) high, such as a footstool. This helps keep your spine neutral. Sitting    When sitting, keep your spine neutral and keep your feet flat on the floor. Use a footrest, if necessary, and keep your thighs parallel to the floor. Avoid rounding your shoulders, and avoid tilting your head forward.  When working at a desk or a computer, keep your desk at a height where your hands are slightly lower than your elbows. Slide your chair under your desk so you are close enough to maintain good posture.  When working at a computer, place your monitor at a height where you are looking straight ahead and you do not have to tilt your head forward or downward to look at the screen. Resting    When lying down and resting, avoid  positions that are most painful for you.  If you have pain with activities such as sitting, bending, stooping, or squatting (flexion-based activities), lie in a position in which your body does not bend very much. For example, avoid curling up on your side with your arms and knees near your chest (fetal position).  If you have pain with activities such as standing for a long time or reaching with your arms (extension-based activities), lie with your spine in a neutral position and bend your knees slightly. Try the following positions:  Lying on your side with a pillow between your knees.  Lying on your back with a pillow under your knees. Lifting    When lifting objects, keep your feet at least shoulder-width apart and tighten your abdominal muscles.  Bend your knees and hips and keep your spine neutral.  It is important to lift using the strength of your legs, not your back. Do not lock your knees straight out.  Always ask for help to lift heavy or awkward objects. This information is not intended to replace advice given to you by your health care provider. Make sure you discuss any questions you have with your health care provider. Document Released: 02/04/2005 Document Revised: 10/12/2015 Document Reviewed: 10/21/2014 Elsevier Interactive Patient Education  2017 [employer].    IF you received an x-ray today, you will receive an invoice from [facility]. Please contact [facility] at [phone] with questions or concerns regarding your invoice.   IF you received labwork today, you will receive an invoice from [facility]. Please contact [facility] at [phone] with questions or concerns regarding your invoice.   Our billing staff will not be able to assist you with questions regarding bills from these companies.  You will be contacted with the lab results as soon as they are available. The fastest way to get your results is to activate your My Chart account.  Instructions are located on the last page of this paperwork. If you have not heard from [place] regarding the results in 2 weeks, please contact this office.

## 2016-06-28 NOTE — Progress Notes (Signed)
PRIMARY CARE AT [facility] [address], [place] [address] [phone]  Date:  06/28/2016   Name:  Tyler Murphy   DOB:  03-24-1960   MRN:  [id number]  PCP:  Biagio Borg, MD    History of Present Illness:  Tyler Murphy is a 56 y.o. male patient who presents to PCP with  Chief Complaint  Patient presents with  . Sciatica    PROBLEM X 1 and COUPLE DAYS     He drove back and forth to [place].  Without any hx of trauma, he has a dull ache along the left buttock that radiates down to the foot. He attempted tylenol 800.   At night, pain radiates down the leg.  Pain with crossing the leg.  He has done no ice or heat at that time.  No swelling that he has noticed.    Patient Active Problem List   Diagnosis Date Noted  . CKD (chronic kidney disease) stage 3, GFR 30-59 ml/min 11/10/2013  . Increased prostate specific antigen (PSA) velocity 11/10/2013  . Nocturia 11/05/2012  . 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 Encounter for long-term (current) use of high-risk medication 11/01/2010  . Encounter for well adult exam with abnormal findings 10/26/2010  . ALLERGIC RHINITIS 09/21/2007  . GERD 09/21/2007  . 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 HYPERCHOLESTEROLEMIA 11/26/2006  . BACK PAIN, LUMBAR, CHRONIC 11/26/2006    Past Medical History:  Diagnosis Date  . ALLERGIC RHINITIS 09/21/2007  . ALLERGY 11/26/2006  . BACK PAIN, LUMBAR, CHRONIC 11/26/2006  . GERD 09/21/2007  . HYPERCHOLESTEROLEMIA 11/26/2006  . 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 OBESITY, MILD 11/26/2006    Past Surgical History:  Procedure Laterality Date  . s/p lithotrypsy    . TONSILLECTOMY      Social History  Substance Use Topics  . Smoking status: Never Smoker  . Smokeless tobacco: Never Used  . Alcohol use No    Family History  Problem Relation Age of Onset  . Adopted: Yes    Allergies  Allergen Reactions  . Bee Venom Hives and Itching  . Lipitor [Atorvastatin] Other (See Comments)   myalgia  . Lovastatin     myalgia    Medication list has been reviewed and updated.  Current Outpatient Prescriptions on File Prior to Visit  Medication Sig Dispense Refill  . aspirin 81 MG tablet Take 1 tablet (81 mg total) by mouth daily. 30 tablet 11  . rosuvastatin (CRESTOR) 10 MG tablet Take 1 tablet (10 mg total) by mouth daily. 90 tablet 3  . ipratropium (ATROVENT) 0.03 % nasal spray Place 2 sprays into both nostrils 2 (two) times daily. (Patient not taking: Reported on 06/28/2016) 30 mL 0   No current facility-administered medications on file prior to visit.     ROS ROS otherwise unremarkable unless listed above.  Physical Examination: BP 122/81 (BP Location: Right Arm, Patient Position: Sitting, Cuff Size: Large)   Pulse 95   Temp 99 F (37.2 C) (Oral)   Resp 16   Ht 5' 8.75" (1.746 m)   Wt 196 lb 12.8 oz (89.3 kg)   SpO2 98%   BMI 29.27 kg/m  Ideal Body Weight: Weight in (lb) to have BMI = 25: 167.7  Physical Exam  Constitutional: He is oriented to person, place, and time. He appears well-developed and well-nourished. No distress.  HENT:  Head: Normocephalic and atraumatic.  Eyes: Conjunctivae and EOM are normal. Pupils are equal, round, and reactive to light.  Cardiovascular: Normal rate.   Pulmonary/Chest: Effort normal. No respiratory distress.  Musculoskeletal:  Tender along the left buttock at the sacrum.  No swelling or spasm detected.  No vertebral tenderness at this time.   Forward flexion/lateral deviation, and horizontal rotation is normal.   Tender near the piriformis region   Neurological: He is alert and oriented to person, place, and time.  Reflex Scores:      Patellar reflexes are 2+ on the right side and 2+ on the left side. Skin: Skin is warm and dry. He is not diaphoretic.  Psychiatric: He has a normal mood and affect. His behavior is normal.     Assessment and Plan: Tyler Murphy is a 56 y.o. male who is here today for cc of low back  pain Sciatic nerve vs piriformis Anti-inflammatory and mucle relaxant.  Will avoid nsaid anti-inflammatory with kidney function Will rtc in 7 days if pain has not improved.  Lumbosacral radiculopathy - Plan: predniSONE (DELTASONE) 20 MG tablet, cyclobenzaprine (FLEXERIL) 10 MG tablet  Ivar Drape, PA-C [facility] 5/13/20188:00 AM

## 2016-07-13 ENCOUNTER — Ambulatory Visit (INDEPENDENT_AMBULATORY_CARE_PROVIDER_SITE_OTHER): Payer: Managed Care, Other (non HMO) | Admitting: Physician Assistant

## 2016-07-13 ENCOUNTER — Encounter: Payer: Self-pay | Admitting: Physician Assistant

## 2016-07-13 VITALS — BP 121/82 | HR 104 | Temp 98.1°F | Resp 17 | Ht 68.75 in | Wt 187.4 lb

## 2016-07-13 DIAGNOSIS — K643 Fourth degree hemorrhoids: Secondary | ICD-10-CM

## 2016-07-13 DIAGNOSIS — K6289 Other specified diseases of anus and rectum: Secondary | ICD-10-CM

## 2016-07-13 MED ORDER — AMOXICILLIN-POT CLAVULANATE 875-125 MG PO TABS: 1.0000 | ORAL_TABLET | Freq: Two times a day (BID) | ORAL | 0 refills | Status: DC

## 2016-07-13 MED ORDER — HYDROCORTISONE ACETATE 25 MG RE SUPP: 25.0000 mg | Freq: Two times a day (BID) | RECTAL | 0 refills | Status: DC

## 2016-07-13 MED ORDER — DOCUSATE SODIUM 100 MG PO CAPS
100.0000 mg | ORAL_CAPSULE | Freq: Two times a day (BID) | ORAL | 0 refills | Status: DC
Start: 2016-07-13 — End: 2016-11-26

## 2016-07-13 NOTE — Progress Notes (Signed)
07/13/2016 10:45 AM   DOB: 07/10/1960 / MRN: [id number]  SUBJECTIVE:  Tyler Murphy is a 56 y.o. male presenting for hemorroid check. Tells me he had a fever on Tuesday. Has noticed blood in his stool. His wife is a Marine scientist and tells him this is a hemorrhoid and he first noticed this 48 hours ago.  States the his anus is also spasming.  He is not eating normally because he has lost his appetite. Fever was up to 101-102 per his wife.    He is allergic to bee venom; lipitor [atorvastatin]; and lovastatin.   He  has a past medical history of ALLERGIC RHINITIS (09/21/2007); ALLERGY (11/26/2006); BACK PAIN, LUMBAR, CHRONIC (11/26/2006); GERD (09/21/2007); HYPERCHOLESTEROLEMIA (11/26/2006); NEPHROLITHIASIS, HX OF (09/21/2007); and OBESITY, MILD (11/26/2006).    He  reports that he has never smoked. He has never used smokeless tobacco. He reports that he does not drink alcohol or use drugs. He  reports that he currently engages in sexual activity. The patient  has a past surgical history that includes Tonsillectomy and s/p lithotrypsy.  His family history is not on file. He was adopted.  Review of Systems  Constitutional: Positive for chills, diaphoresis, fever and weight loss.  Gastrointestinal: Positive for blood in stool and melena. Negative for abdominal pain, constipation, diarrhea, heartburn, nausea and vomiting.  Musculoskeletal: Negative for myalgias.  Neurological: Negative for dizziness.    The problem list and medications were reviewed and updated by myself where necessary and exist elsewhere in the encounter.   OBJECTIVE:  BP 121/82   Pulse (!) 104   Temp 98.1 F (36.7 C) (Oral)   Resp 17   Ht 5' 8.75" (1.746 m)   Wt 187 lb 6 oz (85 kg)   SpO2 98%   BMI 27.87 kg/m   Physical Exam  Constitutional: He is oriented to person, place, and time. He appears well-developed and well-nourished.  Cardiovascular: Normal rate.   Pulmonary/Chest: Effort normal and breath sounds normal. He has no  wheezes. He has no rales.  Abdominal: Soft. Bowel sounds are normal.  Genitourinary: Rectal exam shows mass and tenderness. Rectal exam shows no internal hemorrhoid and anal tone normal.     Musculoskeletal: Normal range of motion.  Neurological: He is alert and oriented to person, place, and time.    No results found for this or any previous visit (from the past 72 hour(s)).  No results found.  ASSESSMENT AND PLAN:  Reiley was seen today for hemorrhoids.  Diagnoses and all orders for this visit:  Anal or rectal pain:  2/2 to problem 2.  Too large to handle here.  I have placed an urgent referral for GI and I hope Benson [facility] and Collene Mares would be able to get him in Tuesday or Wednesday as he will likely require banding and incision.  -     amoxicillin-clavulanate (AUGMENTIN) 875-125 MG tablet; Take 1 tablet by mouth 2 (two) times daily. -     docusate sodium (COLACE) 100 MG capsule; Take 1 capsule (100 mg total) by mouth 2 (two) times daily. -     hydrocortisone (ANUSOL-HC) 25 MG suppository; Place 1 suppository (25 mg total) rectally 2 (two) times daily.  Grade IV hemorrhoids -     Ambulatory referral to Gastroenterology    The patient is advised to call or return to clinic if he does not see an improvement in symptoms, or to seek the care of the closest emergency department if he worsens with the above plan.  Philis Fendt, MHS, PA-C [facility] 07/13/2016 10:45 AM

## 2016-07-13 NOTE — Patient Instructions (Addendum)
If the pain suddenly becomes worse then please go to the ED.  I will try to get you into the GI office as quickly as possible.     IF you received an x-ray today, you will receive an invoice from [facility] Radiology. Please contact [facility] Radiology at [phone] with questions or concerns regarding your invoice.   IF you received labwork today, you will receive an invoice from [facility]. Please contact [facility] at [phone] with questions or concerns regarding your invoice.   Our billing staff will not be able to assist you with questions regarding bills from these companies.  You will be contacted with the lab results as soon as they are available. The fastest way to get your results is to activate your My Chart account. Instructions are located on the last page of this paperwork. If you have not heard from [place] regarding the results in 2 weeks, please contact this office.

## 2016-07-16 ENCOUNTER — Other Ambulatory Visit: Payer: Self-pay | Admitting: Physician Assistant

## 2016-07-16 ENCOUNTER — Telehealth: Payer: Self-pay | Admitting: Physician Assistant

## 2016-07-16 DIAGNOSIS — K643 Fourth degree hemorrhoids: Secondary | ICD-10-CM

## 2016-07-16 NOTE — Telephone Encounter (Signed)
Yes let's do that Clarise Cruz. If you can tee the order up I can sign when I get into work. Thanks for your help.

## 2016-07-16 NOTE — Telephone Encounter (Signed)
Pt called in regards to [facility] referral. Pt said he called [place] office to see if he could be scheduled today and they said he needs to be seen for general surgery for his diagnosis. Do we want to change the referral to general surgery? Thanks.

## 2016-07-16 NOTE — Telephone Encounter (Signed)
We will just need the ambulatory referral changed to general surgery in epic. Thanks!

## 2016-07-16 NOTE — Telephone Encounter (Signed)
Referral sent 07/16/16

## 2016-07-16 NOTE — Telephone Encounter (Signed)
I've just placed as stat. Thank you Clarise Cruz.  Philis Fendt, MS, PA-C 1:24 PM, 07/16/2016

## 2016-07-22 DIAGNOSIS — K644 Residual hemorrhoidal skin tags: Secondary | ICD-10-CM | POA: Insufficient documentation

## 2016-11-04 ENCOUNTER — Encounter: Payer: Self-pay | Admitting: Gastroenterology

## 2016-11-13 ENCOUNTER — Other Ambulatory Visit (INDEPENDENT_AMBULATORY_CARE_PROVIDER_SITE_OTHER): Payer: Managed Care, Other (non HMO)

## 2016-11-13 DIAGNOSIS — Z0001 Encounter for general adult medical examination with abnormal findings: Secondary | ICD-10-CM

## 2016-11-13 LAB — BASIC METABOLIC PANEL
BUN: 17 mg/dL (ref 6–23)
CHLORIDE: 104 meq/L (ref 96–112)
CO2: 27 mEq/L (ref 19–32)
Calcium: 9.6 mg/dL (ref 8.4–10.5)
Creatinine, Ser: 1.14 mg/dL (ref 0.40–1.50)
GFR: 70.28 mL/min (ref >60.00)
Glucose, Bld: 92 mg/dL (ref 70–99)
POTASSIUM: 4.4 meq/L (ref 3.5–5.1)
Sodium: 139 mEq/L (ref 135–145)

## 2016-11-13 LAB — CBC WITH DIFFERENTIAL/PLATELET
BASOS ABS: 0 10*3/uL (ref 0.0–0.1)
Basophils Relative: 0.9 % (ref 0.0–3.0)
EOS ABS: 0.1 10*3/uL (ref 0.0–0.7)
Eosinophils Relative: 2 % (ref 0.0–5.0)
HEMATOCRIT: 49.5 % (ref 39.0–52.0)
Hemoglobin: 16.8 g/dL (ref 13.0–17.0)
LYMPHS PCT: 27.3 % (ref 12.0–46.0)
Lymphs Abs: 1.4 10*3/uL (ref 0.7–4.0)
MCHC: 33.8 g/dL (ref 30.0–36.0)
MCV: 89.6 fl (ref 78.0–100.0)
Monocytes Absolute: 0.4 10*3/uL (ref 0.1–1.0)
Monocytes Relative: 8 % (ref 3.0–12.0)
NEUTROS ABS: 3.3 10*3/uL (ref 1.4–7.7)
NEUTROS PCT: 61.8 % (ref 43.0–77.0)
PLATELETS: 162 10*3/uL (ref 150.0–400.0)
RBC: 5.53 Mil/uL (ref 4.22–5.81)
RDW: 12.9 % (ref 11.5–15.5)
WBC: 5.3 10*3/uL (ref 4.0–10.5)

## 2016-11-13 LAB — HEPATIC FUNCTION PANEL
ALK PHOS: 53 U/L (ref 39–117)
ALT: 22 U/L (ref 0–53)
AST: 15 U/L (ref 0–37)
Albumin: 4.6 g/dL (ref 3.5–5.2)
BILIRUBIN DIRECT: 0.2 mg/dL (ref 0.0–0.3)
TOTAL PROTEIN: 6.7 g/dL (ref 6.0–8.3)
Total Bilirubin: 0.8 mg/dL (ref 0.2–1.2)

## 2016-11-13 LAB — PSA: PSA: 3.14 ng/mL (ref 0.10–4.00)

## 2016-11-13 LAB — URINALYSIS, ROUTINE W REFLEX MICROSCOPIC
Bilirubin Urine: NEGATIVE
Hgb urine dipstick: NEGATIVE
Ketones, ur: NEGATIVE
Leukocytes, UA: NEGATIVE
Nitrite: NEGATIVE
PH: 6 (ref 5.0–8.0)
SPECIFIC GRAVITY, URINE: 1.025 (ref 1.000–1.030)
TOTAL PROTEIN, URINE-UPE24: NEGATIVE
URINE GLUCOSE: NEGATIVE
UROBILINOGEN UA: 0.2 (ref 0.0–1.0)

## 2016-11-13 LAB — LIPID PANEL
CHOL/HDL RATIO: 3
Cholesterol: 122 mg/dL (ref 0–200)
HDL: 37 mg/dL — ABNORMAL LOW (ref >39.00)
LDL Cholesterol: 69 mg/dL (ref 0–99)
NONHDL: 85.18
Triglycerides: 82 mg/dL (ref 0.0–149.0)
VLDL: 16.4 mg/dL (ref 0.0–40.0)

## 2016-11-13 LAB — TSH: TSH: 1.94 u[IU]/mL (ref 0.35–4.50)

## 2016-11-26 ENCOUNTER — Ambulatory Visit (INDEPENDENT_AMBULATORY_CARE_PROVIDER_SITE_OTHER): Payer: Managed Care, Other (non HMO) | Admitting: Internal Medicine

## 2016-11-26 ENCOUNTER — Encounter: Payer: Self-pay | Admitting: Internal Medicine

## 2016-11-26 VITALS — BP 124/80 | HR 84 | Temp 98.2°F | Ht 68.75 in | Wt 201.0 lb

## 2016-11-26 DIAGNOSIS — Z0000 Encounter for general adult medical examination without abnormal findings: Secondary | ICD-10-CM | POA: Diagnosis not present

## 2016-11-26 DIAGNOSIS — Z23 Encounter for immunization: Secondary | ICD-10-CM | POA: Diagnosis not present

## 2016-11-26 DIAGNOSIS — Z114 Encounter for screening for human immunodeficiency virus [HIV]: Secondary | ICD-10-CM | POA: Diagnosis not present

## 2016-11-26 DIAGNOSIS — R972 Elevated prostate specific antigen [PSA]: Secondary | ICD-10-CM | POA: Diagnosis not present

## 2016-11-26 DIAGNOSIS — R351 Nocturia: Secondary | ICD-10-CM

## 2016-11-26 MED ORDER — ZOSTER VAC RECOMB ADJUVANTED 50 MCG/0.5ML IM SUSR: 0.5000 mL | Freq: Once | INTRAMUSCULAR | 1 refills | Status: AC

## 2016-11-26 MED ORDER — TAMSULOSIN HCL 0.4 MG PO CAPS: 0.4000 mg | ORAL_CAPSULE | Freq: Every day | ORAL | 3 refills | Status: DC

## 2016-11-26 NOTE — Assessment & Plan Note (Signed)
UA neg, ok for flomax trial again, vs urology referral if not improved

## 2016-11-26 NOTE — Progress Notes (Signed)
Subjective:    Patient ID: Tyler Murphy, male    DOB: 06-Jul-1960, 56 y.o.   MRN: [id number]  HPI  Here for wellness and f/u;  Overall doing ok;  Pt denies Chest pain, worsening SOB, DOE, wheezing, orthopnea, PND, worsening LE edema, palpitations, dizziness or syncope.  Pt denies neurological change such as new headache, facial or extremity weakness.  Pt denies polydipsia, polyuria, or low sugar symptoms. Pt states overall good compliance with treatment and medications, good tolerability, and has been trying to follow appropriate diet.  Pt denies worsening depressive symptoms, suicidal ideation or panic. No fever, night sweats, wt loss, loss of appetite, or other constitutional symptoms.  Pt states good ability with ADL's, has low fall risk, home safety reviewed and adequate, no other significant changes in hearing or vision, and only occasionally active with exercise.  Has f/u screening colonoscopy next month.   Wt Readings from Last 3 Encounters:  11/26/16 201 lb (91.2 kg)  07/13/16 187 lb 6 oz (85 kg)  06/28/16 196 lb 12.8 oz (89.3 kg)  has some blood in ejaculate.  Has nocturia up to 2 times per night with mild low flow and asks to restart flomax. Denies urinary symptoms such as dysuria, frequency, urgency, flank pain, hematuria or n/v, fever, chills. No other interval hx Past Medical History:  Diagnosis Date  . ALLERGIC RHINITIS 09/21/2007  . ALLERGY 11/26/2006  . BACK PAIN, LUMBAR, CHRONIC 11/26/2006  . GERD 09/21/2007  . HYPERCHOLESTEROLEMIA 11/26/2006  . 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 OBESITY, MILD 11/26/2006   Past Surgical History:  Procedure Laterality Date  . s/p lithotrypsy    . TONSILLECTOMY      reports that he has never smoked. He has never used smokeless tobacco. He reports that he does not drink alcohol or use drugs. family history is not on file. He was adopted. Allergies  Allergen Reactions  . Bee Venom Hives and Itching  . Lipitor [Atorvastatin] Other (See Comments)      myalgia  . Lovastatin     myalgia   Current Outpatient Prescriptions on File Prior to Visit  Medication Sig Dispense Refill  . aspirin 81 MG tablet Take 1 tablet (81 mg total) by mouth daily. 30 tablet 11  . rosuvastatin (CRESTOR) 10 MG tablet Take 1 tablet (10 mg total) by mouth daily. 90 tablet 3   No current facility-administered medications on file prior to visit.    Review of Systems Constitutional: Negative for other unusual diaphoresis, sweats, appetite or weight changes HENT: Negative for other worsening hearing loss, ear pain, facial swelling, mouth sores or neck stiffness.   Eyes: Negative for other worsening pain, redness or other visual disturbance.  Respiratory: Negative for other stridor or swelling Cardiovascular: Negative for other palpitations or other chest pain  Gastrointestinal: Negative for worsening diarrhea or loose stools, blood in stool, distention or other pain Genitourinary: Negative for hematuria, flank pain or other change in urine volume.  Musculoskeletal: Negative for myalgias or other joint swelling.  Skin: Negative for other color change, or other wound or worsening drainage.  Neurological: Negative for other syncope or numbness. Hematological: Negative for other adenopathy or swelling Psychiatric/Behavioral: Negative for hallucinations, other worsening agitation, SI, self-injury, or new decreased concentration All other exam findings    Objective:   Physical Exam BP 124/80   Pulse 84   Temp 98.2 F (36.8 C) (Oral)   Ht 5' 8.75" (1.746 m)   Wt 201 lb (91.2 kg)  SpO2 99%   BMI 29.90 kg/m  VS noted,  Constitutional: Pt is oriented to person, place, and time. Appears well-developed and well-nourished, in no significant distress and comfortable Head: Normocephalic and atraumatic  Eyes: Conjunctivae and EOM are normal. Pupils are equal, round, and reactive to light Right Ear: External ear normal without discharge Left Ear: External ear normal  without discharge Nose: Nose without discharge or deformity Mouth/Throat: Oropharynx is without other ulcerations and moist  Neck: Normal range of motion. Neck supple. No JVD present. No tracheal deviation present or significant neck LA or mass Cardiovascular: Normal rate, regular rhythm, normal heart sounds and intact distal pulses.   Pulmonary/Chest: WOB normal and breath sounds without rales or wheezing  Abdominal: Soft. Bowel sounds are normal. NT. No HSM  Musculoskeletal: Normal range of motion. Exhibits no edema Lymphadenopathy: Has no other cervical adenopathy.  Neurological: Pt is alert and oriented to person, place, and time. Pt has normal reflexes. No cranial nerve deficit. Motor grossly intact, Gait intact Skin: Skin is warm and dry. No rash noted or new ulcerations Psychiatric:  Has normal mood and affect. Behavior is normal without agitation  No other exam findings Lab Results  Component Value Date   WBC 5.3 11/13/2016   HGB 16.8 11/13/2016   HCT 49.5 11/13/2016   PLT 162.0 11/13/2016   GLUCOSE 92 11/13/2016   CHOL 122 11/13/2016   TRIG 82.0 11/13/2016   HDL 37.00 (L) 11/13/2016   LDLDIRECT 150.3 10/12/2009   LDLCALC 69 11/13/2016   ALT 22 11/13/2016   AST 15 11/13/2016   NA 139 11/13/2016   K 4.4 11/13/2016   CL 104 11/13/2016   CREATININE 1.14 11/13/2016   BUN 17 11/13/2016   CO2 27 11/13/2016   TSH 1.94 11/13/2016   PSA 3.14 11/13/2016         Assessment & Plan:

## 2016-11-26 NOTE — Assessment & Plan Note (Signed)

## 2016-11-26 NOTE — Assessment & Plan Note (Signed)
With possible increasing psa, for f/u psa at 6 mo

## 2016-11-26 NOTE — Patient Instructions (Addendum)
You had the flu shot today  Please take all new medication as prescribed - the flomax  Please also return in 6 months for LAB test only for follow up PSA  Your Shingles shot prescription was sent to the pharmacy  Please continue all other medications as before, and refills have been done if requested.  Please have the pharmacy call with any other refills you may need.  Please continue your efforts at being more active, low cholesterol diet, and weight control.  You are otherwise up to date with prevention measures today.  Please keep your appointments with your specialists as you may have planned  Please go to the LAB in the Basement (turn left off the elevator) for the tests to be done today  You will be contacted by phone if any changes need to be made immediately.  Otherwise, you will receive a letter about your results with an explanation, but please check with MyChart first.  Please remember to sign up for MyChart if you have not done so, as this will be important to you in the future with finding out test results, communicating by private email, and scheduling acute appointments online when needed.  Please return in 1 year for your yearly visit, or sooner if needed, with Lab testing done 3-5 days before

## 2016-12-20 ENCOUNTER — Ambulatory Visit (AMBULATORY_SURGERY_CENTER): Payer: Self-pay

## 2016-12-20 VITALS — Ht 70.0 in | Wt 203.4 lb

## 2016-12-20 DIAGNOSIS — Z1211 Encounter for screening for malignant neoplasm of colon: Secondary | ICD-10-CM

## 2016-12-20 MED ORDER — SUPREP BOWEL PREP KIT 17.5-3.13-1.6 GM/177ML PO SOLN: 1.0000 | Freq: Once | ORAL | 0 refills | Status: AC

## 2016-12-20 NOTE — Progress Notes (Signed)
No allergies to eggs or soy No past problems with anesthesia No diet meds No home oxygen  Registered emmi

## 2016-12-24 ENCOUNTER — Encounter: Payer: Self-pay | Admitting: Gastroenterology

## 2017-01-03 ENCOUNTER — Ambulatory Visit (AMBULATORY_SURGERY_CENTER): Payer: Managed Care, Other (non HMO) | Admitting: Gastroenterology

## 2017-01-03 ENCOUNTER — Other Ambulatory Visit: Payer: Self-pay

## 2017-01-03 ENCOUNTER — Encounter: Payer: Self-pay | Admitting: Gastroenterology

## 2017-01-03 VITALS — BP 126/69 | HR 82 | Temp 98.2°F | Resp 15 | Ht 70.0 in | Wt 203.0 lb

## 2017-01-03 DIAGNOSIS — Z1212 Encounter for screening for malignant neoplasm of rectum: Secondary | ICD-10-CM | POA: Diagnosis not present

## 2017-01-03 DIAGNOSIS — Z1211 Encounter for screening for malignant neoplasm of colon: Secondary | ICD-10-CM

## 2017-01-03 MED ORDER — SODIUM CHLORIDE 0.9 % IV SOLN
500.0000 mL | INTRAVENOUS | Status: DC
Start: 2017-01-03 — End: 2017-01-03

## 2017-01-03 NOTE — Op Note (Signed)
[facility] Patient Name: Tyler Murphy Procedure Date: 01/03/2017 8:07 AM MRN: [id number] Endoscopist: Ladene Artist , MD Age: 56 Referring MD:  Date of Birth: 1960/04/23 Gender: Male Account #: [id number] Procedure:                Colonoscopy Indications:              Screening for colorectal malignant neoplasm Medicines:                Monitored Anesthesia Care Procedure:                Pre-Anesthesia Assessment:                           - Prior to the procedure, a History and Physical                            was performed, and patient medications and                            allergies were reviewed. The patient's tolerance of                            previous anesthesia was also reviewed. The risks                            and benefits of the procedure and the sedation                            options and risks were discussed with the patient.                            All questions were answered, and informed consent                            was obtained. Prior Anticoagulants: The patient has                            taken no previous anticoagulant or antiplatelet                            agents. ASA Grade Assessment: II - A patient with                            mild systemic disease. After reviewing the risks                            and benefits, the patient was deemed in                            satisfactory condition to undergo the procedure.                           After obtaining informed consent, the colonoscope  was passed under direct vision. Throughout the                            procedure, the patient's blood pressure, pulse, and                            oxygen saturations were monitored continuously. The                            Colonoscope was introduced through the anus and                            advanced to the the cecum, identified by                            appendiceal orifice and  ileocecal valve. The                            ileocecal valve, appendiceal orifice, and rectum                            were photographed. The quality of the bowel                            preparation was excellent. The colonoscopy was                            performed without difficulty. The patient tolerated                            the procedure well. Scope In: 8:12:59 AM Scope Out: 8:29:21 AM Scope Withdrawal Time: 0 hours 12 minutes 35 seconds  Total Procedure Duration: 0 hours 16 minutes 22 seconds  Findings:                 The perianal and digital rectal examinations were                            normal.                           Internal hemorrhoids were found during                            retroflexion. The hemorrhoids were medium-sized and                            Grade I (internal hemorrhoids that do not prolapse).                           The exam was otherwise without abnormality on                            direct and retroflexion views. Complications:            No immediate complications. Estimated blood loss:  None. Estimated Blood Loss:     Estimated blood loss: none. Impression:               - Internal hemorrhoids.                           - The examination was otherwise normal on direct                            and retroflexion views.                           - No specimens collected. Recommendation:           - Repeat colonoscopy in 10 years for screening                            purposes.                           - Patient has a contact number available for                            emergencies. The signs and symptoms of potential                            delayed complications were discussed with the                            patient. Return to normal activities tomorrow.                            Written discharge instructions were provided to the                            patient.                            - Resume previous diet.                           - Continue present medications. Ladene Artist, MD 01/03/2017 8:32:14 AM This report has been signed electronically.

## 2017-01-03 NOTE — Patient Instructions (Signed)
YOU HAD AN ENDOSCOPIC PROCEDURE TODAY AT [facility]:   Refer to the procedure report that was given to you for any specific questions about what was found during the examination.  If the procedure report does not answer your questions, please call your gastroenterologist to clarify.  If you requested that your care partner not be given the details of your procedure findings, then the procedure report has been included in a sealed envelope for you to review at your convenience later.  YOU SHOULD EXPECT: Some feelings of bloating in the abdomen. Passage of more gas than usual.  Walking can help get rid of the air that was put into your GI tract during the procedure and reduce the bloating. If you had a lower endoscopy (such as a colonoscopy or flexible sigmoidoscopy) you may notice spotting of blood in your stool or on the toilet paper. If you underwent a bowel prep for your procedure, you may not have a normal bowel movement for a few days.  Please Note:  You might notice some irritation and congestion in your nose or some drainage.  This is from the oxygen used during your procedure.  There is no need for concern and it should clear up in a day or so.  SYMPTOMS TO REPORT IMMEDIATELY:   Following lower endoscopy (colonoscopy or flexible sigmoidoscopy):  Excessive amounts of blood in the stool  Significant tenderness or worsening of abdominal pains  Swelling of the abdomen that is new, acute  Fever of 100F or higher   For urgent or emergent issues, a gastroenterologist can be reached at any hour by calling [phone].   DIET:  We do recommend a small meal at first, but then you may proceed to your regular diet.  Drink plenty of fluids but you should avoid alcoholic beverages for 24 hours.  ACTIVITY:  You should plan to take it easy for the rest of today and you should NOT DRIVE or use heavy machinery until tomorrow (because of the sedation medicines used during the test).     FOLLOW UP: Our staff will call the number listed on your records the next business day following your procedure to check on you and address any questions or concerns that you may have regarding the information given to you following your procedure. If we do not reach you, we will leave a message.  However, if you are feeling well and you are not experiencing any problems, there is no need to return our call.  We will assume that you have returned to your regular daily activities without incident.  If any biopsies were taken you will be contacted by phone or by letter within the next 1-3 weeks.  Please call us at [phone] if you have not heard about the biopsies in 3 weeks.    SIGNATURES/CONFIDENTIALITY: You and/or your care partner have signed paperwork which will be entered into your electronic medical record.  These signatures attest to the fact that that the information above on your After Visit Summary has been reviewed and is understood.  Full responsibility of the confidentiality of this discharge information lies with you and/or your care-partner.  There is a banding procedure for your hemorrhoids if they start to bother you.  Just call the office to be scheduled.

## 2017-01-03 NOTE — Progress Notes (Signed)
Pt's states no medical or surgical changes since previsit or office visit. 

## 2017-01-03 NOTE — Progress Notes (Signed)
To recovery, report to RN, VSS. 

## 2017-01-06 ENCOUNTER — Telehealth: Payer: Self-pay | Admitting: *Deleted

## 2017-01-06 NOTE — Telephone Encounter (Signed)
  Follow up Call-  Call back number 01/03/2017  Post procedure Call Back phone  # [phone]  Permission to leave phone message Yes  Some recent data might be hidden     Patient questions:  Do you have a fever, pain , or abdominal swelling? No. Pain Score  0 *  Have you tolerated food without any problems? Yes.    Have you been able to return to your normal activities? Yes.    Do you have any questions about your discharge instructions: Diet   No. Medications  No. Follow up visit  No.  Do you have questions or concerns about your Care? No.  Actions: * If pain score is 4 or above: No action needed, pain <4.

## 2017-01-29 ENCOUNTER — Other Ambulatory Visit: Payer: Self-pay | Admitting: Internal Medicine

## 2017-05-06 ENCOUNTER — Encounter: Payer: Self-pay | Admitting: Family Medicine

## 2017-05-06 ENCOUNTER — Ambulatory Visit: Payer: Managed Care, Other (non HMO) | Admitting: Family Medicine

## 2017-05-06 ENCOUNTER — Ambulatory Visit: Payer: Self-pay

## 2017-05-06 VITALS — BP 133/87 | HR 75 | Temp 98.6°F | Resp 17 | Ht 70.5 in | Wt 210.0 lb

## 2017-05-06 DIAGNOSIS — R040 Epistaxis: Secondary | ICD-10-CM | POA: Diagnosis not present

## 2017-05-06 NOTE — Patient Instructions (Addendum)
Saline nasal spray 4-5 times per day, drink plenty of water during the day, humidifier as we discussed. Stop aspirin for now. Return to the clinic or go to the nearest emergency room if any of your symptoms worsen or new symptoms occur. If nosebleeds continue, return for possible bloodwork. Thanks for coming in today and have a good trip to [place].    Nosebleed, Adult A nosebleed is when blood comes out of the nose. Nosebleeds are common. Usually, they are not a sign of a serious condition. Nosebleeds can happen if a small blood vessel in your nose starts to bleed or if the lining of your nose (mucous membrane) cracks. They are commonly caused by:  Allergies.  Colds.  Picking your nose.  Blowing your nose too hard.  An injury from sticking an object into your nose or getting hit in the nose.  Dry or cold air.  Less common causes of nosebleeds include:  Toxic fumes.  Something abnormal in the nose or in the air-filled spaces in the bones of the face (sinuses).  Growths in the nose, such as polyps.  Medicines or conditions that cause blood to clot slowly.  Certain illnesses or procedures that irritate or dry out the nasal passages.  Follow these instructions at home: When you have a nosebleed:  Sit down and tilt your head slightly forward.  Use a clean towel or tissue to pinch your nostrils under the bony part of your nose. After 10 minutes, let go of your nose and see if bleeding starts again. Do not release pressure before that time. If there is still bleeding, repeat the pinching and holding for 10 minutes until the bleeding stops.  Do not place tissues or gauze in the nose to stop bleeding.  Avoid lying down and avoid tilting your head backward. That may make blood collect in the throat and cause gagging or coughing.  Use a nasal spray decongestant to help with a nosebleed as told by your health care provider.  Do not use petroleum jelly or mineral oil in your nose. It  can drip into your lungs. After a nosebleed:  Avoid blowing your nose or sniffing for a number of hours.  Avoid straining, lifting, or bending at the waist for several days. You may resume other normal activities as you are able.  Use saline spray or a humidifier as told by your health care provider.  Aspirinand blood thinners make bleeding more likely. If you are prescribed these medicines and you suffer from nosebleeds: ? Ask your health care provider if you should stop taking the medicines or if you should adjust the dose. ? Do not stop taking medicines that your health care provider has recommended unless told by your health care provider.  If your nosebleed was caused by dry mucous membranes, use over-the-counter saline nasal spray or gel. This will keep the mucous membranes moist and allow them to heal. If you must use a lubricant: ? Choose one that is water-soluble. ? Use only as much as you need and use it only as often as needed. ? Do not lie down until several hours after you use it. Contact a health care provider if:  You have a fever.  You get nosebleeds often or more often than usual.  You bruise very easily.  You have a nosebleed from having something stuck in your nose.  You have bleeding in your mouth.  You vomit or cough up brown material.  You have a nosebleed after  you start a new medicine. Get help right away if:  You have a nosebleed after a fall or a head injury.  Your nosebleed does not go away after 20 minutes.  You feel dizzy or weak.  You have unusual bleeding from other parts of your body.  You have unusual bruising on other parts of your body.  You become sweaty.  You vomit blood. This information is not intended to replace advice given to you by your health care provider. Make sure you discuss any questions you have with your health care provider. Document Released: 11/14/2004 Document Revised: 10/05/2015 Document Reviewed:  08/22/2015 Elsevier Interactive Patient Education  2018 [employer].       IF you received an x-ray today, you will receive an invoice from [facility]. Please contact [facility] at [phone] with questions or concerns regarding your invoice.   IF you received labwork today, you will receive an invoice from [facility]. Please contact [facility] at [phone] with questions or concerns regarding your invoice.   Our billing staff will not be able to assist you with questions regarding bills from these companies.  You will be contacted with the lab results as soon as they are available. The fastest way to get your results is to activate your My Chart account. Instructions are located on the last page of this paperwork. If you have not heard from [place] regarding the results in 2 weeks, please contact this office.

## 2017-05-06 NOTE — Telephone Encounter (Signed)
Pt. Reports he has had nose bleeds on and off x 2 weeks - mostly occurring in the evening. Last about 5-10 minutes.Denies any other symptoms. Does take a low dose aspirin daily. Appointment made for today.  Reason for Disposition . Hard-to-stop nosebleeds are a chronic symptom (recurrent or ongoing AND present > 4 weeks)  Answer Assessment - Initial Assessment Questions 1. AMOUNT OF BLEEDING: "How bad is the bleeding?" "How much blood was lost?" "Has the bleeding stopped?"   - MILD: needed a couple tissues   - MODERATE: needed many tissues   - SEVERE: large blood clots, soaked many tissues, lasted more than 30 minutes      Bleeding has stopped 2. ONSET: "When did the nosebleed start?"      2-3 weeks 3. FREQUENCY: "How many nosebleeds have you had in the last 24 hours?"      Seems to happen more in the evening 4. RECURRENT SYMPTOMS: "Have there been other recent nosebleeds?" If so, ask: "How long did it take you to stop the bleeding?" "What worked best?"      Last about 5-10 minutes 5. CAUSE: "What do you think caused this nosebleed?"     Unsure 6. LOCAL FACTORS: "Do you have any cold symptoms?", "Have you been rubbing or picking at your nose?"     No 7. SYSTEMIC FACTORS: "Do you have high blood pressure or any bleeding problems?"     No 8. BLOOD THINNERS: "Do you take any blood thinners?" (e.g., coumadin, heparin, aspirin, Plavix)     Low dose aspirin every day 9. OTHER SYMPTOMS: "Do you have any other symptoms?" (e.g., lightheadedness)     No 10. PREGNANCY: "Is there any chance you are pregnant?" "When was your last menstrual period?"       No  Protocols used: NOSEBLEED-A-AH

## 2017-05-06 NOTE — Progress Notes (Signed)
Subjective:  By signing my name below, I, Tyler Murphy, attest that this documentation has been prepared under the direction and in the presence of [name], MD. Electronically Signed: [name], [place]. 05/06/2017 , 6:26 PM .  Patient was seen in Room 2 .   Patient ID: Tyler Murphy, male    DOB: March 17, 1960, 57 y.o.   MRN: [id number] Chief Complaint  Patient presents with  . Epistaxis   HPI Tyler Murphy is a 56 y.o. male Patient complains of nosebleeds. He is on aspirin 81mg  QD. Patient states his right nostril has been bleeding after feeling crusty for the past few weeks. He notes it usually occurs in the evening, once every 3-4 days. He would pick at the crusting or blow his nose, and his nose would start bleeding. He would pack it and eventually his nose bleeds stop. He denies having a humidifier at home. He denies history of nosebleeds. He takes aspirin for cholesterol, primary prevention. He denies easily bruising, bleeding in gums or hematuria.   He has history of allergic rhinitis, and has been taking Claritin a few weeks ago.   He's always out and about with [employer].  He has 3 daughters: one recently married in [place], one recently graduated college in [place], [place], and one in her sophomore year at [employer]. He plans to head out to [facility] next Thursday for a few days.   Patient Active Problem List   Diagnosis Date Noted  . CKD (chronic kidney disease) stage 3, GFR 30-59 ml/min (HCC) 11/10/2013  . Increased prostate specific antigen (PSA) velocity 11/10/2013  . Nocturia 11/05/2012  . 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 Encounter for long-term (current) use of high-risk medication 11/01/2010  . Preventative health care 10/26/2010  . ALLERGIC RHINITIS 09/21/2007  . GERD 09/21/2007  . 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 HYPERCHOLESTEROLEMIA 11/26/2006  . BACK PAIN, LUMBAR, CHRONIC 11/26/2006   Past Medical History:    Diagnosis Date  . ALLERGIC RHINITIS 09/21/2007  . ALLERGY 11/26/2006  . BACK PAIN, LUMBAR, CHRONIC 11/26/2006  . GERD 09/21/2007  . HYPERCHOLESTEROLEMIA 11/26/2006  . 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 OBESITY, MILD 11/26/2006   Past Surgical History:  Procedure Laterality Date  . COLONOSCOPY    . s/p lithotrypsy    . TONSILLECTOMY     Allergies  Allergen Reactions  . Bee Venom Hives and Itching  . Lipitor [Atorvastatin] Other (See Comments)    myalgia  . Lovastatin     myalgia   Prior to Admission medications   Medication Sig Start Date End Date Taking? Authorizing Provider  aspirin 81 MG tablet Take 1 tablet (81 mg total) by mouth daily. 11/10/13   Biagio Borg, MD  Multiple Vitamins-Minerals (MULTIVITAMIN GUMMIES ADULT PO) Take 2 Doses by mouth.    [provider]  rosuvastatin (CRESTOR) 10 MG tablet TAKE ONE TABLET BY MOUTH DAILY 01/29/17   Biagio Borg, MD  tamsulosin (FLOMAX) 0.4 MG CAPS capsule Take 1 capsule (0.4 mg total) by mouth daily. 11/26/16   Biagio Borg, MD   Social History   Socioeconomic History  . Marital status: Married    Spouse name: Not on file  . Number of children: 3  . Years of education: Not on file  . Highest education level: Not on file  Social Needs  . Financial resource strain: Not on file  . Food insecurity - worry: Not on file  . Food insecurity - inability: Not  on file  . Transportation needs - medical: Not on file  . Transportation needs - non-medical: Not on file  Occupational History  . 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 Smoking status: Never Smoker  . Smokeless tobacco: Never Used  Substance and Sexual Activity  . Alcohol use: No  . Drug use: No  . Sexual activity: Yes  Other Topics Concern  . Not on file  Social History Narrative  . Not on file   Review of Systems  Constitutional: Negative for fatigue and unexpected weight  change.  HENT: Positive for nosebleeds.   Eyes: Negative for visual disturbance.  Respiratory: Negative for cough, chest tightness and shortness of breath.   Cardiovascular: Negative for chest pain, palpitations and leg swelling.  Gastrointestinal: Negative for abdominal pain and Murphy in stool.  Genitourinary: Negative for hematuria.  Neurological: Negative for dizziness, light-headedness and headaches.  Hematological: Does not bruise/bleed easily.       Objective:   Physical Exam  Constitutional: He is oriented to person, place, and time. He appears well-developed and well-nourished. No distress.  HENT:  Head: Normocephalic and atraumatic.  Nares: small area of dried Murphy distal septum, no perforation  Eyes: EOM are normal. Pupils are equal, round, and reactive to light.  Neck: Neck supple.  Cardiovascular: Normal rate.  Pulmonary/Chest: Effort normal. No respiratory distress.  Musculoskeletal: Normal range of motion.  Neurological: He is alert and oriented to person, place, and time.  Skin: Skin is warm and dry.  Psychiatric: He has a normal mood and affect. His behavior is normal.  Nursing note and vitals reviewed.   Vitals:   05/06/17 1728  BP: 133/87  Pulse: 75  Resp: 17  Temp: 98.6 F (37 C)  TempSrc: Oral  SpO2: 98%  Weight: 210 lb (95.3 kg)  Height: 5' 10.5" (1.791 m)       Assessment & Plan:    Tyler Murphy is a 57 y.o. male Nosebleed  -Noted more recently, no other areas of bleeding.  Suspect this is from dry/irritated mucosa with weather and possible allergic rhinitis symptoms.  Some crusted Murphy noted on exam without acute epistaxis.  -Saline nasal spray, maintain hydration, humidifier as discussed  -Stop aspirin for now, may not need to restart given primary prevention  -If symptoms persist, check CBC, RTC precautions.  -Did advise necessity of saline nasal spray, humidifier when traveling to location with drier climate  No orders of the defined  types were placed in this encounter.  Patient Instructions   Saline nasal spray 4-5 times per day, drink plenty of water during the day, humidifier as we discussed. Stop aspirin for now. Return to the clinic or go to the nearest emergency room if any of your symptoms worsen or new symptoms occur. If nosebleeds continue, return for possible bloodwork. Thanks for coming in today and have a good trip to [place].    Nosebleed, Adult A nosebleed is when Murphy comes out of the nose. Nosebleeds are common. Usually, they are not a sign of a serious condition. Nosebleeds can happen if a small Murphy vessel in your nose starts to bleed or if the lining of your nose (mucous membrane) cracks. They are commonly caused by:  Allergies.  Colds.  Picking your nose.  Blowing your nose too hard.  An injury from sticking an object into your nose or getting hit in the nose.  Dry or  cold air.  Less common causes of nosebleeds include:  Toxic fumes.  Something abnormal in the nose or in the air-filled spaces in the bones of the face (sinuses).  Growths in the nose, such as polyps.  Medicines or conditions that cause Murphy to clot slowly.  Certain illnesses or procedures that irritate or dry out the nasal passages.  Follow these instructions at home: When you have a nosebleed:  Sit down and tilt your head slightly forward.  Use a clean towel or tissue to pinch your nostrils under the bony part of your nose. After 10 minutes, let go of your nose and see if bleeding starts again. Do not release pressure before that time. If there is still bleeding, repeat the pinching and holding for 10 minutes until the bleeding stops.  Do not place tissues or gauze in the nose to stop bleeding.  Avoid lying down and avoid tilting your head backward. That may make Murphy collect in the throat and cause gagging or coughing.  Use a nasal spray decongestant to help with a nosebleed as told by your health care  provider.  Do not use petroleum jelly or mineral oil in your nose. It can drip into your lungs. After a nosebleed:  Avoid blowing your nose or sniffing for a number of hours.  Avoid straining, lifting, or bending at the waist for several days. You may resume other normal activities as you are able.  Use saline spray or a humidifier as told by your health care provider.  Aspirinand Murphy thinners make bleeding more likely. If you are prescribed these medicines and you suffer from nosebleeds: ? Ask your health care provider if you should stop taking the medicines or if you should adjust the dose. ? Do not stop taking medicines that your health care provider has recommended unless told by your health care provider.  If your nosebleed was caused by dry mucous membranes, use over-the-counter saline nasal spray or gel. This will keep the mucous membranes moist and allow them to heal. If you must use a lubricant: ? Choose one that is water-soluble. ? Use only as much as you need and use it only as often as needed. ? Do not lie down until several hours after you use it. Contact a health care provider if:  You have a fever.  You get nosebleeds often or more often than usual.  You bruise very easily.  You have a nosebleed from having something stuck in your nose.  You have bleeding in your mouth.  You vomit or cough up brown material.  You have a nosebleed after you start a new medicine. Get help right away if:  You have a nosebleed after a fall or a head injury.  Your nosebleed does not go away after 20 minutes.  You feel dizzy or weak.  You have unusual bleeding from other parts of your body.  You have unusual bruising on other parts of your body.  You become sweaty.  You vomit Murphy. This information is not intended to replace advice given to you by your health care provider. Make sure you discuss any questions you have with your health care provider. Document Released:  11/14/2004 Document Revised: 10/05/2015 Document Reviewed: 08/22/2015 Elsevier Interactive Patient Education  2018 [employer].       IF you received an x-ray today, you will receive an invoice from [facility]. Please contact [facility] at [phone] with questions or concerns regarding your invoice.   IF you  received labwork today, you will receive an invoice from [facility]. Please contact [facility] at [phone] with questions or concerns regarding your invoice.   Our billing staff will not be able to assist you with questions regarding bills from these companies.  You will be contacted with the lab results as soon as they are available. The fastest way to get your results is to activate your My Chart account. Instructions are located on the last page of this paperwork. If you have not heard from [place] regarding the results in 2 weeks, please contact this office.       I personally performed the services described in this documentation, which was scribed in my presence. The recorded information has been reviewed and considered for accuracy and completeness, addended by me as needed, and agree with information above.  Signed,   [name], MD [facility].  05/09/17 3:14 PM

## 2017-05-09 ENCOUNTER — Encounter: Payer: Self-pay | Admitting: Family Medicine

## 2017-05-28 ENCOUNTER — Encounter: Payer: Self-pay | Admitting: Physician Assistant

## 2017-07-30 ENCOUNTER — Other Ambulatory Visit: Payer: Self-pay | Admitting: Orthopaedic Surgery

## 2017-07-30 DIAGNOSIS — M5136 Other intervertebral disc degeneration, lumbar region: Secondary | ICD-10-CM

## 2017-08-09 ENCOUNTER — Ambulatory Visit
Admission: RE | Admit: 2017-08-09 | Discharge: 2017-08-09 | Disposition: A | Discharge status: Home / Self Care | Payer: Managed Care, Other (non HMO) | Source: Ambulatory Visit | Attending: Orthopaedic Surgery | Admitting: Orthopaedic Surgery

## 2017-08-09 DIAGNOSIS — M5136 Other intervertebral disc degeneration, lumbar region: Secondary | ICD-10-CM

## 2017-08-15 ENCOUNTER — Ambulatory Visit: Payer: Managed Care, Other (non HMO) | Admitting: Family Medicine

## 2017-08-15 ENCOUNTER — Encounter: Payer: Self-pay | Admitting: Family Medicine

## 2017-08-15 ENCOUNTER — Other Ambulatory Visit: Payer: Self-pay

## 2017-08-15 VITALS — BP 116/70 | HR 93 | Temp 98.6°F | Resp 16 | Ht 69.29 in | Wt 210.0 lb

## 2017-08-15 DIAGNOSIS — R05 Cough: Secondary | ICD-10-CM

## 2017-08-15 DIAGNOSIS — J069 Acute upper respiratory infection, unspecified: Secondary | ICD-10-CM | POA: Diagnosis not present

## 2017-08-15 DIAGNOSIS — R059 Cough, unspecified: Secondary | ICD-10-CM

## 2017-08-15 MED ORDER — HYDROCODONE-HOMATROPINE 5-1.5 MG/5ML PO SYRP: 5.0000 mL | ORAL_SOLUTION | ORAL | 0 refills | Status: DC | PRN

## 2017-08-15 MED ORDER — BENZONATATE 100 MG PO CAPS: 100.0000 mg | ORAL_CAPSULE | Freq: Three times a day (TID) | ORAL | 0 refills | Status: DC | PRN

## 2017-08-15 NOTE — Progress Notes (Signed)
Patient ID: Tyler Murphy, male    DOB: 03/21/1960  Age: 56 y.o. MRN: [id number]  Chief Complaint  Patient presents with  . Cough    pt states the cough is chronic with some chest congestion   . Nasal Congestion    no drainage more congestion    Subjective:   57 year old man who comes in with a history of having been sick for several days with a cough.  It has flared up badly.  He is coughing up a little phlegm.  He coughs daytime and nighttime, but mostly at night is awakening him.  He thinks is probably just a summer cold, but he decided to come on and get it checked.  He does not smoke.  He works in [place] and lives in [place].  His daughter was in from [place] who had a respiratory tract infection.  Current allergies, medications, problem list, past/family and social histories reviewed.  Objective:  BP 116/70   Pulse 93   Temp 98.6 F (37 C) (Oral)   Resp 16   Ht 5' 9.29" (1.76 m)   Wt 210 lb (95.3 kg)   SpO2 98%   BMI 30.75 kg/m   No major acute distress.  Pleasant man.  TMs no not visualized since he has hearing aids.  Throat was clear.  Neck supple without nodes.  Chest clear to auscultation.  No rhonchi, rales, or wheezes.  Heart regular without murmurs.  Assessment & Plan:   Assessment: 1. Cough   2. Acute upper respiratory infection       Plan: Will treat as per instructions.  If he does not get better he might need a chest x-ray, but I do not think other testing is indicated at this time.  No orders of the defined types were placed in this encounter.   Meds ordered this encounter  Medications  . HYDROcodone-homatropine (HYCODAN) 5-1.5 MG/5ML syrup    Sig: Take 5 mLs by mouth every 4 (four) hours as needed.    Dispense:  120 mL    Refill:  0  . benzonatate (TESSALON) 100 MG capsule    Sig: Take 1-2 capsules (100-200 mg total) by mouth 3 (three) times daily as needed.    Dispense:  30 capsule    Refill:  0         Patient Instructions    Drink plenty of fluids and get enough rest  Take benzonatate cough pills 1 or 2 pills 3 times daily as needed for daytime cough.  Take Hycodan cough syrup 1 teaspoon every 4-6 hours as needed for nighttime cough or when you can afford to be a little bit sedated.  Avoid taking when you are going to work.  If you are getting worse, increased shortness of breath, fevers, coughing up a lot more purulent phlegm, or other concerns get rechecked.  IF you received an x-ray today, you will receive an invoice from [facility]. Please contact [facility] at [phone] with questions or concerns regarding your invoice.   IF you received labwork today, you will receive an invoice from [facility]. Please contact [facility] at [phone] with questions or concerns regarding your invoice.   Our billing staff will not be able to assist you with questions regarding bills from these companies.  You will be contacted with the lab results as soon as they are available. The fastest way to get your results is to activate your My Chart account. Instructions are located on the last page of  this paperwork. If you have not heard from [place] regarding the results in 2 weeks, please contact this office.         Return if symptoms worsen or fail to improve.   Ruben Reason, MD 08/15/2017

## 2017-08-15 NOTE — Patient Instructions (Addendum)
Drink plenty of fluids and get enough rest  Take benzonatate cough pills 1 or 2 pills 3 times daily as needed for daytime cough.  Take Hycodan cough syrup 1 teaspoon every 4-6 hours as needed for nighttime cough or when you can afford to be a little bit sedated.  Avoid taking when you are going to work.  If you are getting worse, increased shortness of breath, fevers, coughing up a lot more purulent phlegm, or other concerns get rechecked.  IF you received an x-ray today, you will receive an invoice from [facility]. Please contact [facility] at [phone] with questions or concerns regarding your invoice.   IF you received labwork today, you will receive an invoice from [facility]. Please contact [facility] at [phone] with questions or concerns regarding your invoice.   Our billing staff will not be able to assist you with questions regarding bills from these companies.  You will be contacted with the lab results as soon as they are available. The fastest way to get your results is to activate your My Chart account. Instructions are located on the last page of this paperwork. If you have not heard from [place] regarding the results in 2 weeks, please contact this office.

## 2017-10-14 ENCOUNTER — Encounter: Payer: Self-pay | Admitting: Family Medicine

## 2017-11-28 ENCOUNTER — Encounter: Payer: Managed Care, Other (non HMO) | Admitting: Internal Medicine

## 2017-12-02 ENCOUNTER — Encounter: Payer: Self-pay | Admitting: Internal Medicine

## 2017-12-02 ENCOUNTER — Other Ambulatory Visit (INDEPENDENT_AMBULATORY_CARE_PROVIDER_SITE_OTHER): Payer: Managed Care, Other (non HMO)

## 2017-12-02 ENCOUNTER — Other Ambulatory Visit: Payer: Self-pay | Admitting: Internal Medicine

## 2017-12-02 ENCOUNTER — Ambulatory Visit (INDEPENDENT_AMBULATORY_CARE_PROVIDER_SITE_OTHER): Payer: Managed Care, Other (non HMO) | Admitting: Internal Medicine

## 2017-12-02 VITALS — BP 122/88 | HR 72 | Temp 98.3°F | Ht 69.35 in | Wt 205.0 lb

## 2017-12-02 DIAGNOSIS — Z0000 Encounter for general adult medical examination without abnormal findings: Secondary | ICD-10-CM

## 2017-12-02 DIAGNOSIS — G8929 Other chronic pain: Secondary | ICD-10-CM | POA: Diagnosis not present

## 2017-12-02 DIAGNOSIS — M48061 Spinal stenosis, lumbar region without neurogenic claudication: Secondary | ICD-10-CM | POA: Insufficient documentation

## 2017-12-02 DIAGNOSIS — R972 Elevated prostate specific antigen [PSA]: Secondary | ICD-10-CM

## 2017-12-02 DIAGNOSIS — M25561 Pain in right knee: Secondary | ICD-10-CM | POA: Insufficient documentation

## 2017-12-02 DIAGNOSIS — M25562 Pain in left knee: Secondary | ICD-10-CM

## 2017-12-02 DIAGNOSIS — Z23 Encounter for immunization: Secondary | ICD-10-CM

## 2017-12-02 DIAGNOSIS — Z114 Encounter for screening for human immunodeficiency virus [HIV]: Secondary | ICD-10-CM

## 2017-12-02 LAB — CBC WITH DIFFERENTIAL/PLATELET
BASOS ABS: 0 10*3/uL (ref 0.0–0.1)
BASOS PCT: 0.7 % (ref 0.0–3.0)
EOS ABS: 0.2 10*3/uL (ref 0.0–0.7)
Eosinophils Relative: 2.7 % (ref 0.0–5.0)
HEMATOCRIT: 47.6 % (ref 39.0–52.0)
HEMOGLOBIN: 16.3 g/dL (ref 13.0–17.0)
LYMPHS ABS: 1.7 10*3/uL (ref 0.7–4.0)
Lymphocytes Relative: 26.8 % (ref 12.0–46.0)
MCHC: 34.3 g/dL (ref 30.0–36.0)
MCV: 88.4 fl (ref 78.0–100.0)
MONO ABS: 0.6 10*3/uL (ref 0.1–1.0)
Monocytes Relative: 10 % (ref 3.0–12.0)
NEUTROS ABS: 3.8 10*3/uL (ref 1.4–7.7)
NEUTROS PCT: 59.8 % (ref 43.0–77.0)
PLATELETS: 159 10*3/uL (ref 150.0–400.0)
RBC: 5.39 Mil/uL (ref 4.22–5.81)
RDW: 13 % (ref 11.5–15.5)
WBC: 6.3 10*3/uL (ref 4.0–10.5)

## 2017-12-02 LAB — URINALYSIS, ROUTINE W REFLEX MICROSCOPIC
BILIRUBIN URINE: NEGATIVE
Hgb urine dipstick: NEGATIVE
KETONES UR: NEGATIVE
LEUKOCYTES UA: NEGATIVE
Nitrite: NEGATIVE
PH: 5.5 (ref 5.0–8.0)
RBC / HPF: NONE SEEN (ref 0–?)
Specific Gravity, Urine: >=1.03 — AB (ref 1.000–1.030)
TOTAL PROTEIN, URINE-UPE24: NEGATIVE
UROBILINOGEN UA: 0.2 (ref 0.0–1.0)
Urine Glucose: NEGATIVE

## 2017-12-02 LAB — LIPID PANEL
CHOL/HDL RATIO: 4
CHOLESTEROL: 118 mg/dL (ref 0–200)
HDL: 31.1 mg/dL — AB (ref >39.00)
LDL Cholesterol: 62 mg/dL (ref 0–99)
NonHDL: 86.97
Triglycerides: 127 mg/dL (ref 0.0–149.0)
VLDL: 25.4 mg/dL (ref 0.0–40.0)

## 2017-12-02 LAB — BASIC METABOLIC PANEL
BUN: 23 mg/dL (ref 6–23)
CALCIUM: 9.6 mg/dL (ref 8.4–10.5)
CO2: 28 meq/L (ref 19–32)
CREATININE: 1.15 mg/dL (ref 0.40–1.50)
Chloride: 106 mEq/L (ref 96–112)
GFR: 69.32 mL/min (ref >60.00)
Glucose, Bld: 83 mg/dL (ref 70–99)
Potassium: 4.2 mEq/L (ref 3.5–5.1)
SODIUM: 141 meq/L (ref 135–145)

## 2017-12-02 LAB — HEPATIC FUNCTION PANEL
ALBUMIN: 4.6 g/dL (ref 3.5–5.2)
ALK PHOS: 60 U/L (ref 39–117)
ALT: 25 U/L (ref 0–53)
AST: 15 U/L (ref 0–37)
Bilirubin, Direct: 0.1 mg/dL (ref 0.0–0.3)
TOTAL PROTEIN: 6.8 g/dL (ref 6.0–8.3)
Total Bilirubin: 0.5 mg/dL (ref 0.2–1.2)

## 2017-12-02 LAB — TSH: TSH: 2.9 u[IU]/mL (ref 0.35–4.50)

## 2017-12-02 LAB — PSA: PSA: 3.72 ng/mL (ref 0.10–4.00)

## 2017-12-02 NOTE — Assessment & Plan Note (Signed)
[place] for referral sports medicine

## 2017-12-02 NOTE — Patient Instructions (Addendum)
You had the flu shot today  You will be contacted regarding the referral for: Sports Medicine  Please continue all other medications as before, and refills have been done if requested.  Please have the pharmacy call with any other refills you may need.  Please continue your efforts at being more active, low cholesterol diet, and weight control.  You are otherwise up to date with prevention measures today.  Please keep your appointments with your specialists as you may have planned  Please go to the LAB in the Basement (turn left off the elevator) for the tests to be done today  You will be contacted by phone if any changes need to be made immediately.  Otherwise, you will receive a letter about your results with an explanation, but please check with MyChart first.  Please remember to sign up for MyChart if you have not done so, as this will be important to you in the future with finding out test results, communicating by private email, and scheduling acute appointments online when needed.  Please return in 1 year for your yearly visit, or sooner if needed, with Lab testing done 3-5 days before

## 2017-12-02 NOTE — Progress Notes (Signed)
Subjective:    Patient ID: Tyler Murphy, male    DOB: [birth year], 57 y.o.   MRN: [id number]  HPI  Here for wellness and f/u;  Overall doing ok;  Pt denies Chest pain, worsening SOB, DOE, wheezing, orthopnea, PND, worsening LE edema, palpitations, dizziness or syncope.  Pt denies neurological change such as new headache, facial or extremity weakness.  Pt denies polydipsia, polyuria, or low sugar symptoms. Pt states overall good compliance with treatment and medications, good tolerability, and has been trying to follow appropriate diet.  Pt denies worsening depressive symptoms, suicidal ideation or panic. No fever, night sweats, wt loss, loss of appetite, or other constitutional symptoms.  Pt states good ability with ADL's, has low fall risk, home safety reviewed and adequate, no other significant changes in hearing or vision, and only occasionally active with exercise.  S/p basal cell ca taken off a few wks per derm left elbow.  Gets full body exam yearly. Does also get a shooting spasm mild pain to right groin area without swelling.  Knees starting to ache more occasionally, plans to see sports medicine. Past Medical History:  Diagnosis Date  . ALLERGIC RHINITIS 09/21/2007  . ALLERGY 11/26/2006  . BACK PAIN, LUMBAR, CHRONIC 11/26/2006  . GERD 09/21/2007  . HYPERCHOLESTEROLEMIA 11/26/2006  . 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 OBESITY, MILD 11/26/2006   Past Surgical History:  Procedure Laterality Date  . COLONOSCOPY    . s/p lithotrypsy    . TONSILLECTOMY      reports that he has never smoked. He has never used smokeless tobacco. He reports that he does not drink alcohol or use drugs. family history is not on file. He was adopted. Allergies  Allergen Reactions  . Bee Venom Hives and Itching  . Lipitor [Atorvastatin] Other (See Comments)    myalgia  . Lovastatin     myalgia   Current Outpatient Medications on File Prior to Visit  Medication Sig Dispense Refill  . Multiple Vitamins-Minerals  (MULTIVITAMIN GUMMIES ADULT PO) Take 2 Doses by mouth.    . rosuvastatin (CRESTOR) 10 MG tablet TAKE ONE TABLET BY MOUTH DAILY 90 tablet 3  . tamsulosin (FLOMAX) 0.4 MG CAPS capsule Take 1 capsule (0.4 mg total) by mouth daily. 90 capsule 3   No current facility-administered medications on file prior to visit.    Review of Systems Constitutional: Negative for other unusual diaphoresis, sweats, appetite or weight changes HENT: Negative for other worsening hearing loss, ear pain, facial swelling, mouth sores or neck stiffness.   Eyes: Negative for other worsening pain, redness or other visual disturbance.  Respiratory: Negative for other stridor or swelling Cardiovascular: Negative for other palpitations or other chest pain  Gastrointestinal: Negative for worsening diarrhea or loose stools, blood in stool, distention or other pain Genitourinary: Negative for hematuria, flank pain or other change in urine volume.  Musculoskeletal: Negative for myalgias or other joint swelling.  Skin: Negative for other color change, or other wound or worsening drainage.  Neurological: Negative for other syncope or numbness. Hematological: Negative for other adenopathy or swelling Psychiatric/Behavioral: Negative for hallucinations, other worsening agitation, SI, self-injury, or new decreased concentration All other system neg per pt    Objective:   Physical Exam BP 122/88   Pulse 72   Temp 98.3 F (36.8 C) (Oral)   Ht 5' 9.35" (1.761 m)   Wt 205 lb (93 kg)   SpO2 97%   BMI 29.97 kg/m  VS noted,  Constitutional: Pt is  oriented to person, place, and time. Appears well-developed and well-nourished, in no significant distress and comfortable Head: Normocephalic and atraumatic  Eyes: Conjunctivae and EOM are normal. Pupils are equal, round, and reactive to light Right Ear: External ear normal without discharge Left Ear: External ear normal without discharge Nose: Nose without discharge or  deformity Mouth/Throat: Oropharynx is without other ulcerations and moist  Neck: Normal range of motion. Neck supple. No JVD present. No tracheal deviation present or significant neck LA or mass Cardiovascular: Normal rate, regular rhythm, normal heart sounds and intact distal pulses.   Pulmonary/Chest: WOB normal and breath sounds without rales or wheezing  Abdominal: Soft. Bowel sounds are normal. NT. No HSM  Musculoskeletal: Normal range of motion. Exhibits no edema Lymphadenopathy: Has no other cervical adenopathy.  Neurological: Pt is alert and oriented to person, place, and time. Pt has normal reflexes. No cranial nerve deficit. Motor grossly intact, Gait intact Skin: Skin is warm and dry. No rash noted or new ulcerations Psychiatric:  Has normal mood and affect. Behavior is normal without agitation No other exam findings Lab Results  Component Value Date   WBC 5.3 11/13/2016   HGB 16.8 11/13/2016   HCT 49.5 11/13/2016   PLT 162.0 11/13/2016   GLUCOSE 92 11/13/2016   CHOL 122 11/13/2016   TRIG 82.0 11/13/2016   HDL 37.00 (L) 11/13/2016   LDLDIRECT 150.3 10/12/2009   LDLCALC 69 11/13/2016   ALT 22 11/13/2016   AST 15 11/13/2016   NA 139 11/13/2016   K 4.4 11/13/2016   CL 104 11/13/2016   CREATININE 1.14 11/13/2016   BUN 17 11/13/2016   CO2 27 11/13/2016   TSH 1.94 11/13/2016   PSA 3.14 11/13/2016          Assessment & Plan:

## 2017-12-02 NOTE — Assessment & Plan Note (Signed)

## 2017-12-03 ENCOUNTER — Telehealth: Payer: Self-pay

## 2017-12-03 LAB — HIV ANTIBODY (ROUTINE TESTING W REFLEX): HIV 1&2 Ab, 4th Generation: NONREACTIVE

## 2017-12-03 NOTE — Telephone Encounter (Signed)
-----   Message from Biagio Borg, MD sent at 12/02/2017 12:30 PM EDT ----- Left message on MyChart, pt to cont same tx except  The test results show that your current treatment is OK, except the PSA is again somewhat increased.  We should ask you to repeat the PSA in 6 months to check further, instead of waiting for the whole year.   I will place the order, and you would not get a reminder call, but please put on your calendar to come to the LAB only in 6 months for the repeat PSA test.  You should be notified about this from the office as well.    Tyler Murphy to please inform pt, I will do the lab order

## 2017-12-03 NOTE — Telephone Encounter (Signed)
Pt has been informed of results and expressed understanding.  °

## 2017-12-19 ENCOUNTER — Other Ambulatory Visit: Payer: Self-pay | Admitting: Internal Medicine

## 2017-12-19 ENCOUNTER — Ambulatory Visit: Payer: Managed Care, Other (non HMO) | Admitting: Internal Medicine

## 2017-12-19 ENCOUNTER — Encounter: Payer: Self-pay | Admitting: Internal Medicine

## 2017-12-19 ENCOUNTER — Ambulatory Visit (INDEPENDENT_AMBULATORY_CARE_PROVIDER_SITE_OTHER)
Admission: RE | Admit: 2017-12-19 | Discharge: 2017-12-19 | Disposition: A | Discharge status: Home / Self Care | Payer: Managed Care, Other (non HMO) | Source: Ambulatory Visit | Attending: Internal Medicine | Admitting: Internal Medicine

## 2017-12-19 VITALS — BP 130/80 | HR 102 | Temp 98.5°F | Resp 16 | Ht 69.35 in | Wt 211.4 lb

## 2017-12-19 DIAGNOSIS — M25551 Pain in right hip: Secondary | ICD-10-CM

## 2017-12-19 NOTE — Patient Instructions (Signed)
Have an x-ray today.     See Dr Tamala Julian on the 12th.

## 2017-12-19 NOTE — Progress Notes (Signed)
Subjective:    Patient ID: Tyler Murphy, male    DOB: [birth year], 57 y.o.   MRN: [id number]  HPI The patient is here for an acute visit.   Right groin/lateral hip pain:  It is sporadic.  It may be when he is standing or sitting.  It does not occur at night/sleeping.  It is severe pain and will make him hold his breath.  It started three weeks ago.  It can go a couple of days w/o pain.  Two days it spasmed and it lasted a little longer 5 sec.    No radiation.  No N/T.  No pain with walking or going up stairs.  It feels tight at times.  No pain with cough, straining.  No bulge   No regular exercise.  He has not tried anything for the pain since it is transient. He has an appt with Dr Tamala Julian, but wanted to be seen earlier since the pain was more severe yesterday and he was concerned it would occur while driving - it was severe.   Medications and allergies reviewed with patient and updated if appropriate.  Patient Active Problem List   Diagnosis Date Noted  . Lumbar spinal stenosis 12/02/2017  . Bilateral knee pain 12/02/2017  . CKD (chronic kidney disease) stage 3, GFR 30-59 ml/min (HCC) 11/10/2013  . Increased prostate specific antigen (PSA) velocity 11/10/2013  . Nocturia 11/05/2012  . 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 Encounter for long-term (current) use of high-risk medication 11/01/2010  . Preventative health care 10/26/2010  . ALLERGIC RHINITIS 09/21/2007  . GERD 09/21/2007  . 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 HYPERCHOLESTEROLEMIA 11/26/2006  . BACK PAIN, LUMBAR, CHRONIC 11/26/2006    Current Outpatient Medications on File Prior to Visit  Medication Sig Dispense Refill  . Multiple Vitamins-Minerals (MULTIVITAMIN GUMMIES ADULT PO) Take 2 Doses by mouth.    . rosuvastatin (CRESTOR) 10 MG tablet TAKE ONE TABLET BY MOUTH DAILY 90 tablet 3  . tamsulosin (FLOMAX) 0.4 MG CAPS capsule Take 1 capsule (0.4 mg total) by mouth daily. 90 capsule 3   No current  facility-administered medications on file prior to visit.     Past Medical History:  Diagnosis Date  . ALLERGIC RHINITIS 09/21/2007  . ALLERGY 11/26/2006  . BACK PAIN, LUMBAR, CHRONIC 11/26/2006  . GERD 09/21/2007  . HYPERCHOLESTEROLEMIA 11/26/2006  . 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 OBESITY, MILD 11/26/2006    Past Surgical History:  Procedure Laterality Date  . COLONOSCOPY    . s/p lithotrypsy    . TONSILLECTOMY      Social History   Socioeconomic History  . Marital status: Married    Spouse name: Not on file  . Number of children: 3  . Years of education: Not on file  . Highest education level: Not on file  Occupational History  . 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 Financial resource strain: Not on file  . Food insecurity:    Worry: Not on file    Inability: Not on file  . Transportation needs:    Medical: Not on file    Non-medical: Not on file  Tobacco Use  . Smoking status: Never Smoker  . Smokeless tobacco: Never Used  Substance and Sexual Activity  . Alcohol use: No  . Drug use: No  . Sexual activity: Yes  Lifestyle  . Physical activity:    Days  per week: Not on file    Minutes per session: Not on file  . Stress: Not on file  Relationships  . Social connections:    Talks on phone: Not on file    Gets together: Not on file    Attends religious service: Not on file    Active member of club or organization: Not on file    Attends meetings of clubs or organizations: Not on file    Relationship status: Not on file  Other Topics Concern  . Not on file  Social History Narrative  . Not on file    Family History  Adopted: Yes    Review of Systems Per HPI    Objective:   Vitals:   12/19/17 1359  BP: 130/80  Pulse: (!) 102  Resp: 16  Temp: 98.5 F (36.9 C)  SpO2: 97%   BP Readings from Last 3 Encounters:  12/19/17 130/80  12/02/17 122/88  08/15/17  116/70   Wt Readings from Last 3 Encounters:  12/19/17 211 lb 6.4 oz (95.9 kg)  12/02/17 205 lb (93 kg)  08/15/17 210 lb (95.3 kg)   Body mass index is 30.9 kg/m.   Physical Exam    A right hip/groin exam was performed.   SWELLING: none, no bulge EFFUSION: no WARMTH: no warmth  TENDERNESS: no tenderness on lateral aspect of hip or in groin ROM: full ROM of right hip w/o pain  GAIT: normal  NEUROLOGICAL EXAM: normal sensation RLE          Assessment & Plan:    See Problem List for Assessment and Plan of chronic medical problems.

## 2017-12-20 DIAGNOSIS — M25551 Pain in right hip: Secondary | ICD-10-CM | POA: Insufficient documentation

## 2017-12-20 NOTE — Assessment & Plan Note (Signed)
Intermittent, occurs with standing, walking or sitting Sharp, transient pain lateral right groin No hernia on exam  Nerve impingement or other impingement Will get Xray  Has appt with Dr Tamala Julian

## 2017-12-21 ENCOUNTER — Encounter: Payer: Self-pay | Admitting: Internal Medicine

## 2017-12-22 ENCOUNTER — Other Ambulatory Visit: Payer: Self-pay | Admitting: Internal Medicine

## 2017-12-29 NOTE — Progress Notes (Signed)
[facility] [address] [place], [place] [address] Phone: [phone] Subjective:     I [name] am serving as a Education administrator for Dr. Hulan Saas.   I'm seeing this patient by the request  of:    CC: Bilateral knee pain  IRS:[id number]  Tyler Murphy is a 57 y.o. male coming in with complaint of  bilateral knee pain. Left is worse. Shooting pain in right hip. Xrays. Was told he has arthritis in the hip. Pain was at night. Believes age has something to do with it.   Onset- Chronic  Location- Superior patella Character- Achy, dull  Aggravating factors- flexion, standing  Severity-initially was 7 out of 10 but now only 2 out of 10     Past Medical History:  Diagnosis Date  . ALLERGIC RHINITIS 09/21/2007  . ALLERGY 11/26/2006  . BACK PAIN, LUMBAR, CHRONIC 11/26/2006  . GERD 09/21/2007  . HYPERCHOLESTEROLEMIA 11/26/2006  . 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 OBESITY, MILD 11/26/2006   Past Surgical History:  Procedure Laterality Date  . COLONOSCOPY    . s/p lithotrypsy    . TONSILLECTOMY     Social History   Socioeconomic History  . Marital status: Married    Spouse name: Not on file  . Number of children: 3  . Years of education: Not on file  . Highest education level: Not on file  Occupational History  . 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 Financial resource strain: Not on file  . Food insecurity:    Worry: Not on file    Inability: Not on file  . Transportation needs:    Medical: Not on file    Non-medical: Not on file  Tobacco Use  . Smoking status: Never Smoker  . Smokeless tobacco: Never Used  Substance and Sexual Activity  . Alcohol use: No  . Drug use: No  . Sexual activity: Yes  Lifestyle  . Physical activity:    Days per week: Not on file    Minutes per session: Not on file  . Stress: Not on file  Relationships  . Social  connections:    Talks on phone: Not on file    Gets together: Not on file    Attends religious service: Not on file    Active member of club or organization: Not on file    Attends meetings of clubs or organizations: Not on file    Relationship status: Not on file  Other Topics Concern  . Not on file  Social History Narrative  . Not on file   Allergies  Allergen Reactions  . Bee Venom Hives and Itching  . Lipitor [Atorvastatin] Other (See Comments)    myalgia  . Lovastatin     myalgia   Family History  Adopted: Yes     Current Outpatient Medications (Cardiovascular):  .  rosuvastatin (CRESTOR) 10 MG tablet, TAKE ONE TABLET BY MOUTH DAILY     Current Outpatient Medications (Other):  [name]  Multiple Vitamins-Minerals (MULTIVITAMIN GUMMIES ADULT PO), Take 2 Doses by mouth. .  tamsulosin (FLOMAX) 0.4 MG CAPS capsule, TAKE ONE CAPSULE BY MOUTH DAILY .  Diclofenac Sodium (PENNSAID) 2 % SOLN, Place 2 g onto the skin 2 (two) times daily.    Past medical history, social, surgical and family history all reviewed in electronic medical record.  No pertanent information unless stated regarding to the  chief complaint.   Review of Systems:  No headache, visual changes, nausea, vomiting, diarrhea, constipation, dizziness, abdominal pain, skin rash, fevers, chills, night sweats, weight loss, swollen lymph nodes, body aches, joint swelling, muscle aches, chest pain, shortness of breath, mood changes.  Positive muscle aches  Objective  Blood pressure 118/80, pulse (!) 103, height 5\' 9"  (1.753 m), weight 209 lb (94.8 kg), SpO2 97 %.  General: No apparent distress alert and oriented x3 mood and affect normal, dressed appropriately.  HEENT: Pupils equal, extraocular movements intact  Respiratory: Patient's speak in full sentences and does not appear short of breath  Cardiovascular: No lower extremity edema, non tender, no erythema  Skin: Warm dry intact with no signs of infection or rash on  extremities or on axial skeleton.  Abdomen: Soft nontender  Neuro: Cranial nerves II through XII are intact, neurovascularly intact in all extremities with 2+ DTRs and 2+ pulses.  Lymph: No lymphadenopathy of posterior or anterior cervical chain or axillae bilaterally.  Gait normal with good balance and coordination.  MSK:  Non tender with full range of motion and good stability and symmetric strength and tone of shoulders, elbows, wrist, hip, and ankles bilaterally.   Knee: Bilateral Normal to inspection with no erythema or effusion or obvious bony abnormalities. Palpation normal with no warmth, joint line tenderness, patellar tenderness, or condyle tenderness. ROM full in flexion and extension and lower leg rotation. Ligaments with solid consistent endpoints including ACL, PCL, LCL, MCL. Negative Mcmurray's, Apley's, and Thessalonian tests. Mild painful patellar compression. Patellar glide with mild crepitus. Patellar and quadriceps tendons unremarkable. Hamstring and quadriceps strength is normal.  MSK US performed of: Bilateral knee This study was ordered, performed, and interpreted by Charlann Boxer D.O.  Knee: Bilateral knee that show some narrowing of the patellofemoral joint left and the right.  Mild narrowing of the medial joint space on the left side.  Patient otherwise mild calcific changes noted beneath the kneecap.  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 The above documentation has been reviewed and is accurate and complete [name], DO       Note: This dictation was prepared with Dragon dictation along with smaller phrase technology. Any transcriptional errors that result from this process are unintentional.

## 2017-12-30 ENCOUNTER — Ambulatory Visit: Payer: Managed Care, Other (non HMO) | Admitting: Family Medicine

## 2017-12-30 ENCOUNTER — Ambulatory Visit (INDEPENDENT_AMBULATORY_CARE_PROVIDER_SITE_OTHER)
Admission: RE | Admit: 2017-12-30 | Discharge: 2017-12-30 | Disposition: A | Discharge status: Home / Self Care | Payer: Managed Care, Other (non HMO) | Source: Ambulatory Visit | Attending: Family Medicine | Admitting: Family Medicine

## 2017-12-30 ENCOUNTER — Encounter: Payer: Self-pay | Admitting: Family Medicine

## 2017-12-30 ENCOUNTER — Ambulatory Visit: Payer: Self-pay

## 2017-12-30 VITALS — BP 118/80 | HR 103 | Ht 69.0 in | Wt 209.0 lb

## 2017-12-30 DIAGNOSIS — M25561 Pain in right knee: Secondary | ICD-10-CM | POA: Diagnosis not present

## 2017-12-30 DIAGNOSIS — G8929 Other chronic pain: Secondary | ICD-10-CM

## 2017-12-30 DIAGNOSIS — M1710 Unilateral primary osteoarthritis, unspecified knee: Secondary | ICD-10-CM

## 2017-12-30 DIAGNOSIS — M25562 Pain in left knee: Principal | ICD-10-CM

## 2017-12-30 MED ORDER — DICLOFENAC SODIUM 2 % TD SOLN: 2.0000 g | Freq: Two times a day (BID) | TRANSDERMAL | 3 refills | Status: DC

## 2017-12-30 NOTE — Patient Instructions (Signed)
Good to see you.  Ice 20 minutes 2 times daily. Usually after activity and before bed. Exercises 3 times a week.  pennsaid pinkie amount topically 2 times daily as needed.  Over the counter get  Vitamin D 2000 IU daily ' Turmeric 500mg  daily  Tart cherry extract any dose  See me again in 4 weeks to make sure you are doing much better

## 2017-12-30 NOTE — Assessment & Plan Note (Signed)
Bilateral.  Discussed icing regimen and home exercise.  Discussed which activities to do which wants to avoid.  Patient given topical anti-inflammatories, discussed over-the-counter medications, discussed which activities to do which was to avoid.  Increase activity slowly.  Follow-up again in 4 to 6 weeks

## 2018-02-04 ENCOUNTER — Ambulatory Visit: Payer: Managed Care, Other (non HMO) | Admitting: Family Medicine

## 2018-02-05 ENCOUNTER — Other Ambulatory Visit: Payer: Self-pay | Admitting: Internal Medicine

## 2018-11-05 ENCOUNTER — Encounter: Payer: Self-pay | Admitting: Internal Medicine

## 2018-11-05 DIAGNOSIS — E559 Vitamin D deficiency, unspecified: Secondary | ICD-10-CM

## 2018-11-05 DIAGNOSIS — E538 Deficiency of other specified B group vitamins: Secondary | ICD-10-CM

## 2018-11-05 DIAGNOSIS — E611 Iron deficiency: Secondary | ICD-10-CM

## 2018-11-24 ENCOUNTER — Other Ambulatory Visit (INDEPENDENT_AMBULATORY_CARE_PROVIDER_SITE_OTHER): Payer: Managed Care, Other (non HMO)

## 2018-11-24 DIAGNOSIS — E559 Vitamin D deficiency, unspecified: Secondary | ICD-10-CM

## 2018-11-24 DIAGNOSIS — E611 Iron deficiency: Secondary | ICD-10-CM

## 2018-11-24 DIAGNOSIS — E538 Deficiency of other specified B group vitamins: Secondary | ICD-10-CM

## 2018-11-24 DIAGNOSIS — Z0000 Encounter for general adult medical examination without abnormal findings: Secondary | ICD-10-CM

## 2018-11-24 LAB — CBC WITH DIFFERENTIAL/PLATELET
Basophils Absolute: 0 10*3/uL (ref 0.0–0.1)
Basophils Relative: 0.5 % (ref 0.0–3.0)
Eosinophils Absolute: 0.1 10*3/uL (ref 0.0–0.7)
Eosinophils Relative: 1.7 % (ref 0.0–5.0)
HCT: 48 % (ref 39.0–52.0)
Hemoglobin: 16.2 g/dL (ref 13.0–17.0)
Lymphocytes Relative: 26.7 % (ref 12.0–46.0)
Lymphs Abs: 1.6 10*3/uL (ref 0.7–4.0)
MCHC: 33.8 g/dL (ref 30.0–36.0)
MCV: 89.6 fl (ref 78.0–100.0)
Monocytes Absolute: 0.6 10*3/uL (ref 0.1–1.0)
Monocytes Relative: 9.9 % (ref 3.0–12.0)
Neutro Abs: 3.7 10*3/uL (ref 1.4–7.7)
Neutrophils Relative %: 61.2 % (ref 43.0–77.0)
Platelets: 162 10*3/uL (ref 150.0–400.0)
RBC: 5.36 Mil/uL (ref 4.22–5.81)
RDW: 13.3 % (ref 11.5–15.5)
WBC: 6.1 10*3/uL (ref 4.0–10.5)

## 2018-11-24 LAB — HEPATIC FUNCTION PANEL
ALT: 22 U/L (ref 0–53)
AST: 16 U/L (ref 0–37)
Albumin: 4.5 g/dL (ref 3.5–5.2)
Alkaline Phosphatase: 59 U/L (ref 39–117)
Bilirubin, Direct: 0.2 mg/dL (ref 0.0–0.3)
Total Bilirubin: 1 mg/dL (ref 0.2–1.2)
Total Protein: 6.7 g/dL (ref 6.0–8.3)

## 2018-11-24 LAB — LIPID PANEL
Cholesterol: 139 mg/dL (ref 0–200)
HDL: 32.4 mg/dL — ABNORMAL LOW (ref >39.00)
LDL Cholesterol: 80 mg/dL (ref 0–99)
NonHDL: 106.24
Total CHOL/HDL Ratio: 4
Triglycerides: 132 mg/dL (ref 0.0–149.0)
VLDL: 26.4 mg/dL (ref 0.0–40.0)

## 2018-11-24 LAB — IBC PANEL
Iron: 181 ug/dL — ABNORMAL HIGH (ref 42–165)
Saturation Ratios: 57 % — ABNORMAL HIGH (ref 20.0–50.0)
Transferrin: 227 mg/dL (ref 212.0–360.0)

## 2018-11-24 LAB — URINALYSIS, ROUTINE W REFLEX MICROSCOPIC
Bilirubin Urine: NEGATIVE
Hgb urine dipstick: NEGATIVE
Ketones, ur: NEGATIVE
Leukocytes,Ua: NEGATIVE
Nitrite: NEGATIVE
RBC / HPF: NONE SEEN (ref 0–?)
Specific Gravity, Urine: 1.02 (ref 1.000–1.030)
Total Protein, Urine: NEGATIVE
Urine Glucose: NEGATIVE
Urobilinogen, UA: 0.2 (ref 0.0–1.0)
pH: 6.5 (ref 5.0–8.0)

## 2018-11-24 LAB — PSA: PSA: 3.63 ng/mL (ref 0.10–4.00)

## 2018-11-24 LAB — BASIC METABOLIC PANEL
BUN: 21 mg/dL (ref 6–23)
CO2: 30 mEq/L (ref 19–32)
Calcium: 9.9 mg/dL (ref 8.4–10.5)
Chloride: 103 mEq/L (ref 96–112)
Creatinine, Ser: 1.2 mg/dL (ref 0.40–1.50)
GFR: 61.88 mL/min (ref >60.00)
Glucose, Bld: 90 mg/dL (ref 70–99)
Potassium: 4.6 mEq/L (ref 3.5–5.1)
Sodium: 139 mEq/L (ref 135–145)

## 2018-11-24 LAB — TSH: TSH: 1.71 u[IU]/mL (ref 0.35–4.50)

## 2018-11-24 LAB — VITAMIN D 25 HYDROXY (VIT D DEFICIENCY, FRACTURES): VITD: 28.3 ng/mL — ABNORMAL LOW (ref 30.00–100.00)

## 2018-11-24 LAB — VITAMIN B12: Vitamin B-12: 520 pg/mL (ref 211–911)

## 2018-12-04 ENCOUNTER — Other Ambulatory Visit: Payer: Self-pay

## 2018-12-04 ENCOUNTER — Ambulatory Visit (INDEPENDENT_AMBULATORY_CARE_PROVIDER_SITE_OTHER): Payer: Managed Care, Other (non HMO) | Admitting: Internal Medicine

## 2018-12-04 ENCOUNTER — Encounter: Payer: Self-pay | Admitting: Internal Medicine

## 2018-12-04 VITALS — BP 124/76 | HR 79 | Temp 98.5°F | Ht 69.0 in | Wt 204.0 lb

## 2018-12-04 DIAGNOSIS — Z0000 Encounter for general adult medical examination without abnormal findings: Secondary | ICD-10-CM

## 2018-12-04 DIAGNOSIS — E559 Vitamin D deficiency, unspecified: Secondary | ICD-10-CM | POA: Insufficient documentation

## 2018-12-04 DIAGNOSIS — Z23 Encounter for immunization: Secondary | ICD-10-CM | POA: Diagnosis not present

## 2018-12-04 DIAGNOSIS — R972 Elevated prostate specific antigen [PSA]: Secondary | ICD-10-CM | POA: Diagnosis not present

## 2018-12-04 MED ORDER — VITAMIN D (ERGOCALCIFEROL) 1.25 MG (50000 UNIT) PO CAPS: 50000.0000 [IU] | ORAL_CAPSULE | ORAL | 0 refills | Status: DC

## 2018-12-04 NOTE — Progress Notes (Signed)
Subjective:    Patient ID: Tyler Murphy, male    DOB: 04/01/1960, 58 y.o.   MRN: [id number]  HPI  Here for wellness and f/u;  Overall doing ok;  Pt denies Chest pain, worsening SOB, DOE, wheezing, orthopnea, PND, worsening LE edema, palpitations, dizziness or syncope.  Pt denies neurological change such as new headache, facial or extremity weakness.  Pt denies polydipsia, polyuria, or low sugar symptoms. Pt states overall good compliance with treatment and medications, good tolerability, and has been trying to follow appropriate diet.  Pt denies worsening depressive symptoms, suicidal ideation or panic. No fever, night sweats, wt loss, loss of appetite, or other constitutional symptoms.  Pt states good ability with ADL's, has low fall risk, home safety reviewed and adequate, no other significant changes in hearing or vision, and only occasionally active with exercise.  Denies urinary symptoms such as dysuria, frequency, urgency, flank pain, hematuria or n/v, fever, chills. Past Medical History:  Diagnosis Date  . ALLERGIC RHINITIS 09/21/2007  . ALLERGY 11/26/2006  . BACK PAIN, LUMBAR, CHRONIC 11/26/2006  . GERD 09/21/2007  . HYPERCHOLESTEROLEMIA 11/26/2006  . 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 OBESITY, MILD 11/26/2006   Past Surgical History:  Procedure Laterality Date  . COLONOSCOPY    . s/p lithotrypsy    . TONSILLECTOMY      reports that he has never smoked. He has never used smokeless tobacco. He reports that he does not drink alcohol or use drugs. family history is not on file. He was adopted. Allergies  Allergen Reactions  . Bee Venom Hives and Itching  . Lipitor [Atorvastatin] Other (See Comments)    myalgia  . Lovastatin     myalgia   Current Outpatient Medications on File Prior to Visit  Medication Sig Dispense Refill  . Diclofenac Sodium (PENNSAID) 2 % SOLN Place 2 g onto the skin 2 (two) times daily. 112 g 3  . Multiple Vitamins-Minerals (MULTIVITAMIN GUMMIES ADULT PO) Take 2  Doses by mouth.    . rosuvastatin (CRESTOR) 10 MG tablet TAKE ONE TABLET BY MOUTH DAILY 90 tablet 1  . tamsulosin (FLOMAX) 0.4 MG CAPS capsule TAKE ONE CAPSULE BY MOUTH DAILY 90 capsule 2   No current facility-administered medications on file prior to visit.    Review of Systems Constitutional: Negative for other unusual diaphoresis, sweats, appetite or weight changes HENT: Negative for other worsening hearing loss, ear pain, facial swelling, mouth sores or neck stiffness.   Eyes: Negative for other worsening pain, redness or other visual disturbance.  Respiratory: Negative for other stridor or swelling Cardiovascular: Negative for other palpitations or other chest pain  Gastrointestinal: Negative for worsening diarrhea or loose stools, blood in stool, distention or other pain Genitourinary: Negative for hematuria, flank pain or other change in urine volume.  Musculoskeletal: Negative for myalgias or other joint swelling.  Skin: Negative for other color change, or other wound or worsening drainage.  Neurological: Negative for other syncope or numbness. Hematological: Negative for other adenopathy or swelling Psychiatric/Behavioral: Negative for hallucinations, other worsening agitation, SI, self-injury, or new decreased concentration All otherwise neg per pt     Objective:   Physical Exam BP 124/76   Pulse 79   Temp 98.5 F (36.9 C) (Oral)   Ht 5\' 9"  (1.753 m)   Wt 204 lb (92.5 kg)   SpO2 97%   BMI 30.13 kg/m  VS noted,  Constitutional: Pt is oriented to person, place, and time. Appears well-developed and well-nourished, in no  significant distress and comfortable Head: Normocephalic and atraumatic  Eyes: Conjunctivae and EOM are normal. Pupils are equal, round, and reactive to light Right Ear: External ear normal without discharge Left Ear: External ear normal without discharge Nose: Nose without discharge or deformity Mouth/Throat: Oropharynx is without other ulcerations and  moist  Neck: Normal range of motion. Neck supple. No JVD present. No tracheal deviation present or significant neck LA or mass Cardiovascular: Normal rate, regular rhythm, normal heart sounds and intact distal pulses.   Pulmonary/Chest: WOB normal and breath sounds without rales or wheezing  Abdominal: Soft. Bowel sounds are normal. NT. No HSM  Musculoskeletal: Normal range of motion. Exhibits no edema Lymphadenopathy: Has no other cervical adenopathy.  Neurological: Pt is alert and oriented to person, place, and time. Pt has normal reflexes. No cranial nerve deficit. Motor grossly intact, Gait intact Skin: Skin is warm and dry. No rash noted or new ulcerations Psychiatric:  Has normal mood and affect. Behavior is normal without agitation All otherwise neg per pt Lab Results  Component Value Date   WBC 6.1 11/24/2018   HGB 16.2 11/24/2018   HCT 48.0 11/24/2018   PLT 162.0 11/24/2018   GLUCOSE 90 11/24/2018   CHOL 139 11/24/2018   TRIG 132.0 11/24/2018   HDL 32.40 (L) 11/24/2018   LDLDIRECT 150.3 10/12/2009   LDLCALC 80 11/24/2018   ALT 22 11/24/2018   AST 16 11/24/2018   NA 139 11/24/2018   K 4.6 11/24/2018   CL 103 11/24/2018   CREATININE 1.20 11/24/2018   BUN 21 11/24/2018   CO2 30 11/24/2018   TSH 1.71 11/24/2018   PSA 3.63 11/24/2018      Assessment & Plan:

## 2018-12-04 NOTE — Assessment & Plan Note (Signed)
For oral replacement, then f/u lab in 71mo

## 2018-12-04 NOTE — Assessment & Plan Note (Signed)
Asympt, for f/u psa at 6 mo

## 2018-12-04 NOTE — Assessment & Plan Note (Signed)

## 2018-12-04 NOTE — Patient Instructions (Addendum)
You had the flu shot today, and Tdap tetanus shot today  Please take Vitamin D 50000 units weekly for 12 weeks, then plan to change to OTC Vitamin D3 at 2000 units per day, indefinitely.  Please continue all other medications as before, and refills have been done if requested.  Please have the pharmacy call with any other refills you may need.  Please continue your efforts at being more active, low cholesterol diet, and weight control.  You are otherwise up to date with prevention measures today.  Please keep your appointments with your specialists as you may have planned  Please return in 6 months for LAB only for repeat Vit D and PSA  Please return in 1 year for your yearly visit, or sooner if needed, with Lab testing done 3-5 days before

## 2018-12-22 ENCOUNTER — Other Ambulatory Visit: Payer: Self-pay | Admitting: Internal Medicine

## 2019-02-03 ENCOUNTER — Other Ambulatory Visit: Payer: Self-pay | Admitting: Internal Medicine

## 2019-05-10 ENCOUNTER — Encounter: Payer: Self-pay | Admitting: Internal Medicine

## 2019-05-24 IMAGING — DX DG HIP (WITH OR WITHOUT PELVIS) 2-3V*R*
2 series · 2 of 2 positions shown · non-contrast
Comparison: None.

CLINICAL DATA: Right hip pain for 3 weeks

EXAM:
DG HIP (WITH OR WITHOUT PELVIS) 2-3V RIGHT

[hip ap]
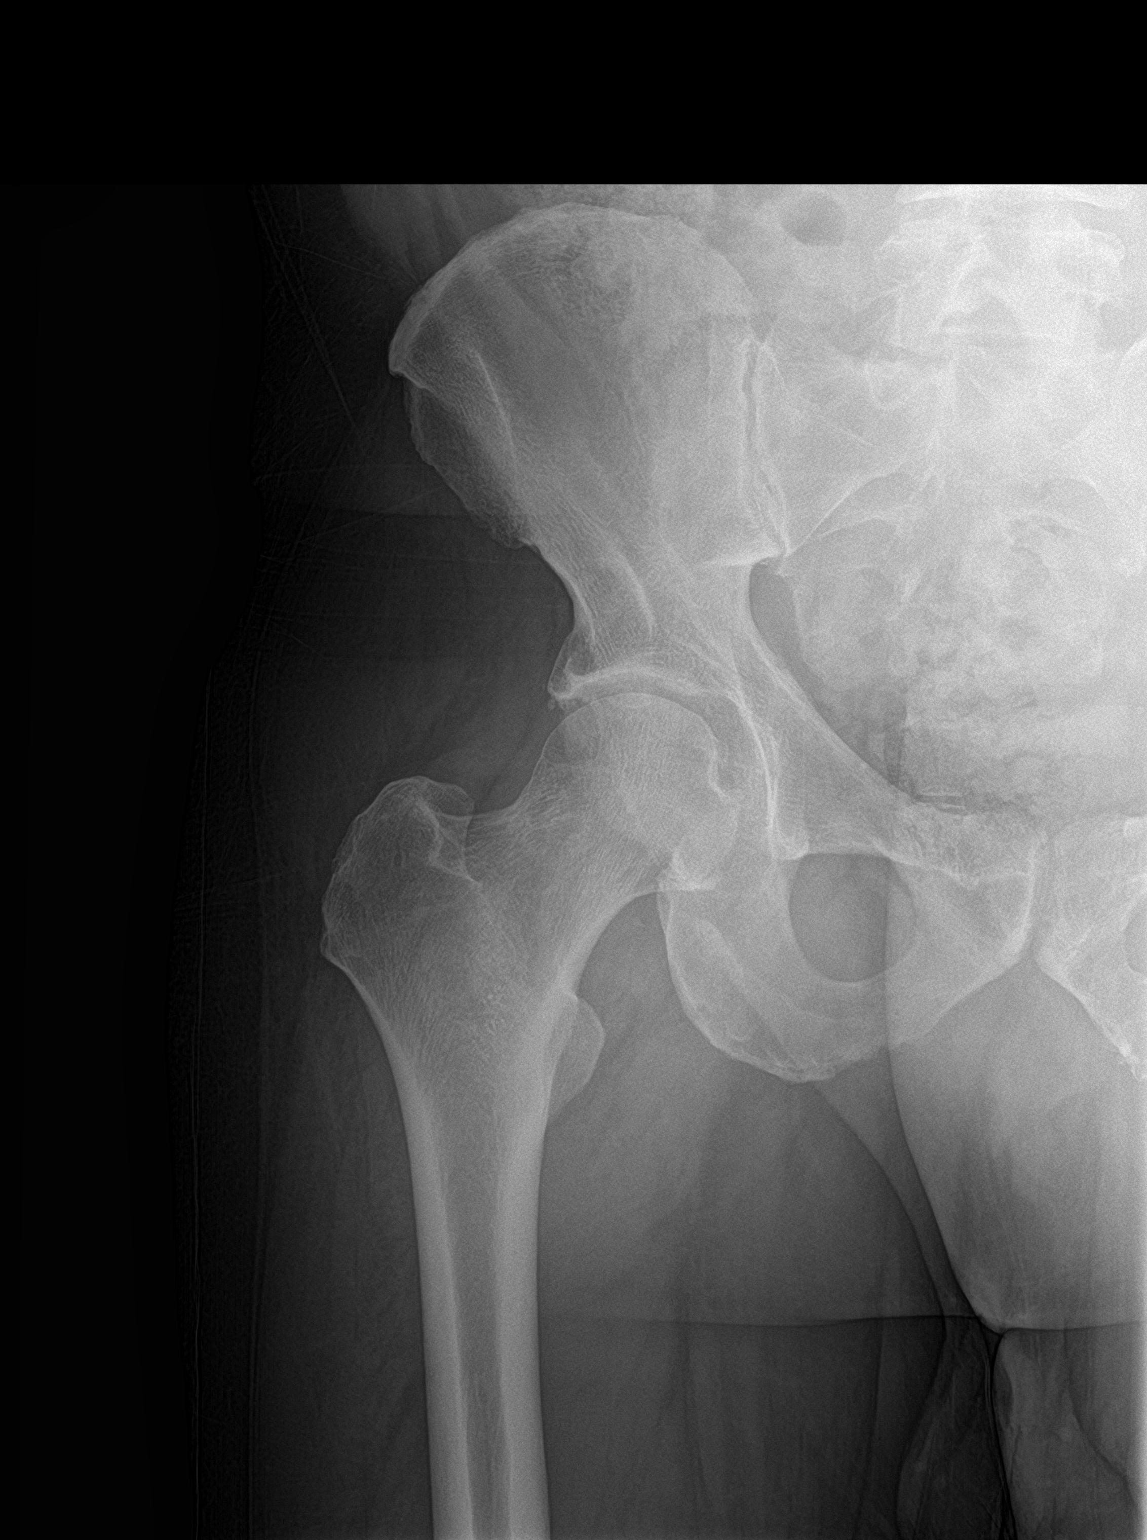

[hip lat]
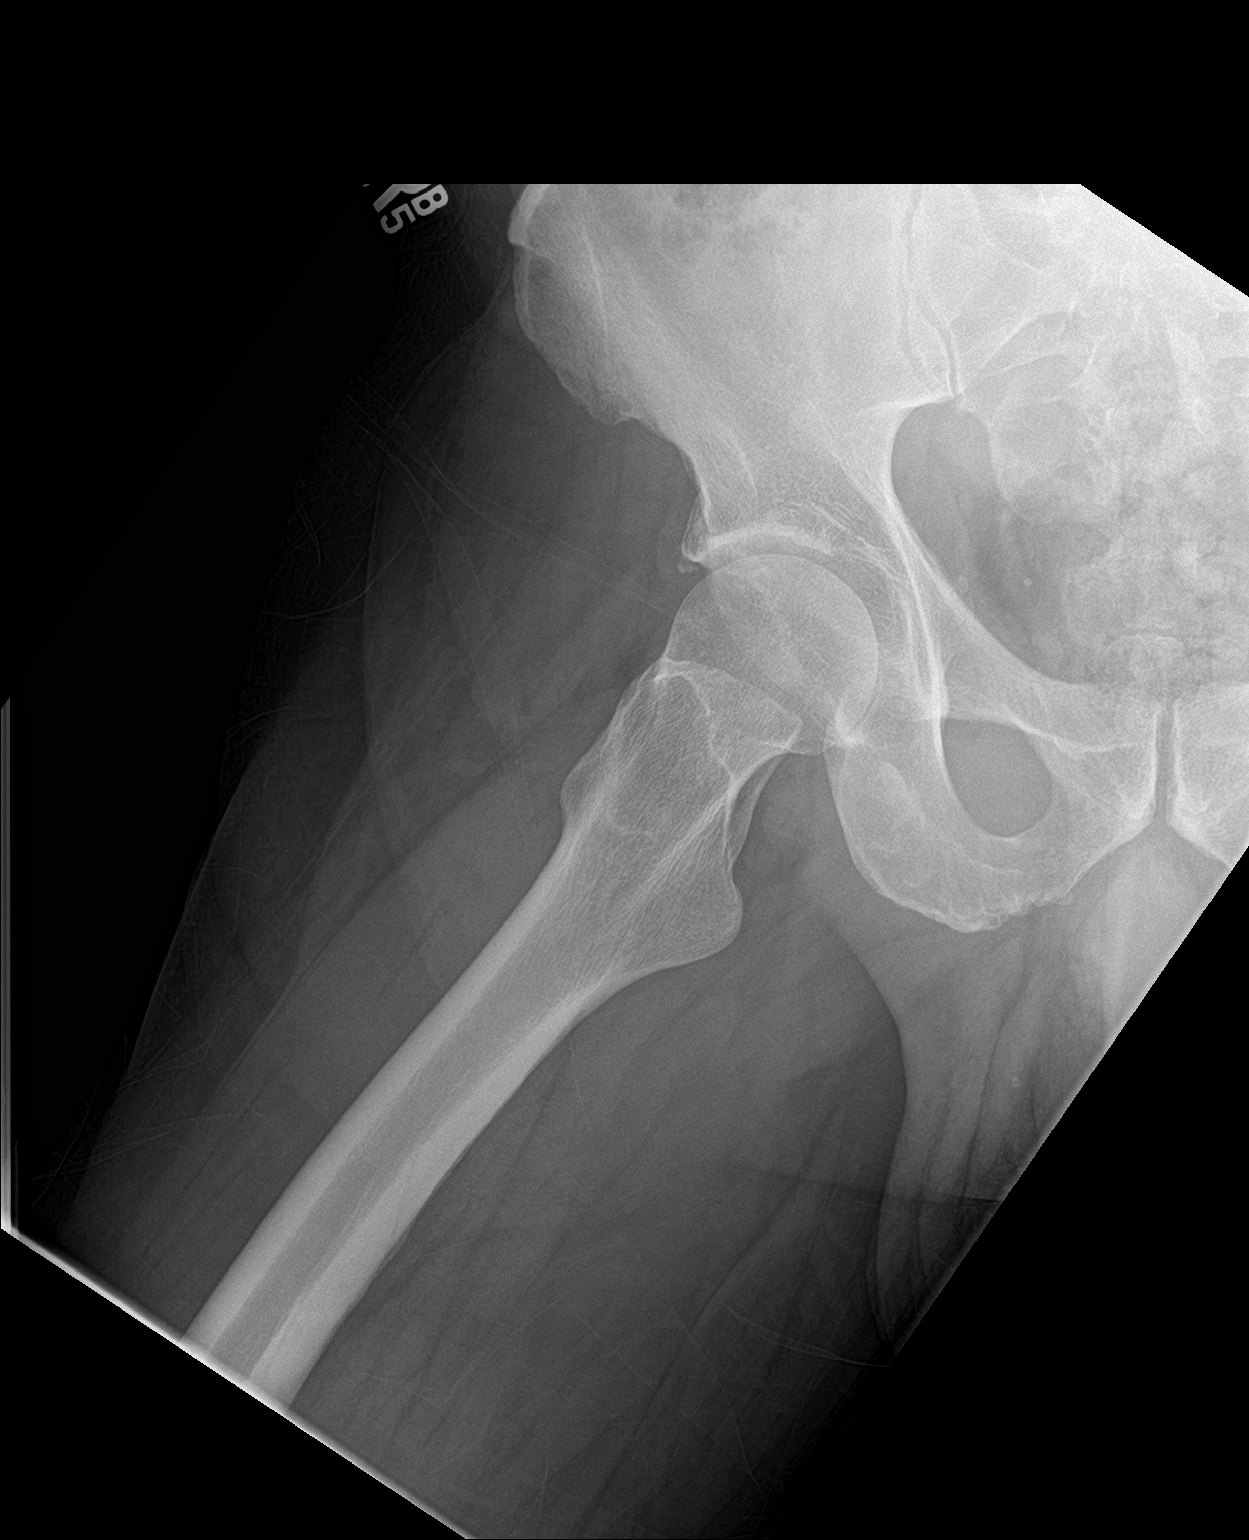

[2 of 2 positions shown; findings below may reference images not displayed]

FINDINGS: There is no evidence of hip fracture or dislocation. There is mild
osteophyte formation of the lateral acetabulum. There is no evidence
of arthropathy or other focal bone abnormality.
IMPRESSION: No acute abnormality.  Mild degenerative change of the acetabulum.

## 2019-09-13 ENCOUNTER — Telehealth (INDEPENDENT_AMBULATORY_CARE_PROVIDER_SITE_OTHER): Payer: Managed Care, Other (non HMO) | Admitting: Internal Medicine

## 2019-09-13 DIAGNOSIS — R05 Cough: Secondary | ICD-10-CM

## 2019-09-13 DIAGNOSIS — J309 Allergic rhinitis, unspecified: Secondary | ICD-10-CM | POA: Diagnosis not present

## 2019-09-13 DIAGNOSIS — K219 Gastro-esophageal reflux disease without esophagitis: Secondary | ICD-10-CM

## 2019-09-13 DIAGNOSIS — R059 Cough, unspecified: Secondary | ICD-10-CM

## 2019-09-13 MED ORDER — DOXYCYCLINE HYCLATE 100 MG PO TABS: 100.0000 mg | ORAL_TABLET | Freq: Two times a day (BID) | ORAL | 0 refills | Status: DC

## 2019-09-13 MED ORDER — HYDROCODONE-HOMATROPINE 5-1.5 MG/5ML PO SYRP: 5.0000 mL | ORAL_SOLUTION | Freq: Four times a day (QID) | ORAL | 0 refills | Status: AC | PRN

## 2019-09-13 MED ORDER — HYDROCODONE-HOMATROPINE 5-1.5 MG/5ML PO SYRP: 5.0000 mL | ORAL_SOLUTION | Freq: Four times a day (QID) | ORAL | 0 refills | Status: DC | PRN

## 2019-09-13 NOTE — Patient Instructions (Signed)
Please take all new medication as prescribed - the antibiotic and cough medicine as needed

## 2019-09-13 NOTE — Progress Notes (Signed)
Patient ID: Tyler Murphy, male   DOB: 02-13-1961, 59 y.o.   MRN: [id number]  Virtual Visit via Video Note  I connected with Tyler Murphy on 09/13/19 at  3:40 PM EDT by a video enabled telemedicine application and verified that I am speaking with the correct person using two identifiers.  Location of all participants today Patient: at home Provider: at office   I discussed the limitations of evaluation and management by telemedicine and the availability of in person appointments. The patient expressed understanding and agreed to proceed.  History of Present Illness: Here with acute onset mild to mod 2-3 days ST, HA, general weakness and malaise, with prod cough greenish sputum, but Pt denies chest pain, increased sob or doe, wheezing, orthopnea, PND, increased LE swelling, palpitations, dizziness or syncope.  Pt denies new neurological symptoms such as new headache, or facial or extremity weakness or numbness   Pt denies polydipsia, polyuria,  Does have several wks ongoing nasal allergy symptoms with clearish congestion, itch and sneezing, without fever, pain, ST, cough, swelling or wheezing.  Denies worsening reflux, abd pain, dysphagia, n/v, bowel change or blood. Past Medical History:  Diagnosis Date  . ALLERGIC RHINITIS 09/21/2007  . ALLERGY 11/26/2006  . BACK PAIN, LUMBAR, CHRONIC 11/26/2006  . GERD 09/21/2007  . HYPERCHOLESTEROLEMIA 11/26/2006  . 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 OBESITY, MILD 11/26/2006   Past Surgical History:  Procedure Laterality Date  . COLONOSCOPY    . s/p lithotrypsy    . TONSILLECTOMY      reports that he has never smoked. He has never used smokeless tobacco. He reports that he does not drink alcohol and does not use drugs. family history is not on file. He was adopted. Allergies  Allergen Reactions  . Bee Venom Hives and Itching  . Lipitor [Atorvastatin] Other (See Comments)    myalgia  . Lovastatin     myalgia   Current Outpatient Medications on File  Prior to Visit  Medication Sig Dispense Refill  . Diclofenac Sodium (PENNSAID) 2 % SOLN Place 2 g onto the skin 2 (two) times daily. 112 g 3  . Multiple Vitamins-Minerals (MULTIVITAMIN GUMMIES ADULT PO) Take 2 Doses by mouth.    . rosuvastatin (CRESTOR) 10 MG tablet TAKE ONE TABLET BY MOUTH DAILY 90 tablet 3  . tamsulosin (FLOMAX) 0.4 MG CAPS capsule TAKE ONE CAPSULE BY MOUTH DAILY 90 capsule 1  . Vitamin D, Ergocalciferol, (DRISDOL) 1.25 MG (50000 UT) CAPS capsule Take 1 capsule (50,000 Units total) by mouth every 7 (seven) days. 12 capsule 0   No current facility-administered medications on file prior to visit.    Observations/Objective: Alert, NAD, appropriate mood and affect, resps normal, cn 2-12 intact, moves all 4s, no visible rash or swelling Lab Results  Component Value Date   WBC 6.1 11/24/2018   HGB 16.2 11/24/2018   HCT 48.0 11/24/2018   PLT 162.0 11/24/2018   GLUCOSE 90 11/24/2018   CHOL 139 11/24/2018   TRIG 132.0 11/24/2018   HDL 32.40 (L) 11/24/2018   LDLDIRECT 150.3 10/12/2009   LDLCALC 80 11/24/2018   ALT 22 11/24/2018   AST 16 11/24/2018   NA 139 11/24/2018   K 4.6 11/24/2018   CL 103 11/24/2018   CREATININE 1.20 11/24/2018   BUN 21 11/24/2018   CO2 30 11/24/2018   TSH 1.71 11/24/2018   PSA 3.63 11/24/2018   Assessment and Plan: See notes  Follow Up Instructions: See notes   I discussed the  assessment and treatment plan with the patient. The patient was provided an opportunity to ask questions and all were answered. The patient agreed with the plan and demonstrated an understanding of the instructions.   The patient was advised to call back or seek an in-person evaluation if the symptoms worsen or if the condition fails to improve as anticipated.  Cathlean Cower, MD

## 2019-09-20 ENCOUNTER — Encounter: Payer: Self-pay | Admitting: Internal Medicine

## 2019-09-20 DIAGNOSIS — R059 Cough, unspecified: Secondary | ICD-10-CM | POA: Insufficient documentation

## 2019-09-20 DIAGNOSIS — R05 Cough: Secondary | ICD-10-CM | POA: Insufficient documentation

## 2019-09-20 NOTE — Assessment & Plan Note (Signed)
Ok for otc antihistamine and nasacort

## 2019-09-20 NOTE — Assessment & Plan Note (Addendum)

## 2019-09-20 NOTE — Assessment & Plan Note (Signed)
C/w bronchitis vs pna, declines cxr, Mild to mod, for antibx course, cough med prn, to f/u any worsening symptoms or concerns

## 2019-10-11 ENCOUNTER — Ambulatory Visit: Payer: Managed Care, Other (non HMO) | Admitting: Family

## 2019-10-11 ENCOUNTER — Encounter: Payer: Self-pay | Admitting: Family

## 2019-10-11 ENCOUNTER — Other Ambulatory Visit: Payer: Self-pay

## 2019-10-11 VITALS — BP 118/72 | HR 68 | Temp 98.3°F | Wt 206.0 lb

## 2019-10-11 DIAGNOSIS — L237 Allergic contact dermatitis due to plants, except food: Secondary | ICD-10-CM | POA: Diagnosis not present

## 2019-10-11 MED ORDER — METHYLPREDNISOLONE ACETATE 40 MG/ML IJ SUSP
40.0000 mg | Freq: Once | INTRAMUSCULAR | Status: AC
  Administered 2019-10-11: 40 mg via INTRAMUSCULAR

## 2019-10-11 NOTE — Patient Instructions (Signed)
Contact Dermatitis °Dermatitis is redness, soreness, and swelling (inflammation) of the skin. Contact dermatitis is a reaction to something that touches the skin. °There are two types of contact dermatitis: °· Irritant contact dermatitis. This happens when something bothers (irritates) your skin, like soap. °· Allergic contact dermatitis. This is caused when you are exposed to something that you are allergic to, such as poison ivy. °What are the causes? °· Common causes of irritant contact dermatitis include: °? Makeup. °? Soaps. °? Detergents. °? Bleaches. °? Acids. °? Metals, such as nickel. °· Common causes of allergic contact dermatitis include: °? Plants. °? Chemicals. °? Jewelry. °? Latex. °? Medicines. °? Preservatives in products, such as clothing. °What increases the risk? °· Having a job that exposes you to things that bother your skin. °· Having asthma or eczema. °What are the signs or symptoms? °Symptoms may happen anywhere the irritant has touched your skin. Symptoms include: °· Dry or flaky skin. °· Redness. °· Cracks. °· Itching. °· Pain or a burning feeling. °· Blisters. °· Blood or clear fluid draining from skin cracks. °With allergic contact dermatitis, swelling may occur. This may happen in places such as the eyelids, mouth, or genitals. °How is this treated? °· This condition is treated by checking for the cause of the reaction and protecting your skin. Treatment may also include: °? Steroid creams, ointments, or medicines. °? Antibiotic medicines or other ointments, if you have a skin infection. °? Lotion or medicines to help with itching. °? A bandage (dressing). °Follow these instructions at home: °Skin care °· Moisturize your skin as needed. °· Put cool cloths on your skin. °· Put a baking soda paste on your skin. Stir water into baking soda until it looks like a paste. °· Do not scratch your skin. °· Avoid having things rub up against your skin. °· Avoid the use of soaps, perfumes, and  dyes. °Medicines °· Take or apply over-the-counter and prescription medicines only as told by your doctor. °· If you were prescribed an antibiotic medicine, take or apply it as told by your doctor. Do not stop using it even if your condition starts to get better. °Bathing °· Take a bath with: °? Epsom salts. °? Baking soda. °? Colloidal oatmeal. °· Bathe less often. °· Bathe in warm water. Avoid using hot water. °Bandage care °· If you were given a bandage, change it as told by your health care provider. °· Wash your hands with soap and water before and after you change your bandage. If soap and water are not available, use hand sanitizer. °General instructions °· Avoid the things that caused your reaction. If you do not know what caused it, keep a journal. Write down: °? What you eat. °? What skin products you use. °? What you drink. °? What you wear in the area that has symptoms. This includes jewelry. °· Check the affected areas every day for signs of infection. Check for: °? More redness, swelling, or pain. °? More fluid or blood. °? Warmth. °? Pus or a bad smell. °· Keep all follow-up visits as told by your doctor. This is important. °Contact a doctor if: °· You do not get better with treatment. °· Your condition gets worse. °· You have signs of infection, such as: °? More swelling. °? Tenderness. °? More redness. °? Soreness. °? Warmth. °· You have a fever. °· You have new symptoms. °Get help right away if: °· You have a very bad headache. °· You have neck pain. °·   Your neck is stiff. °· You throw up (vomit). °· You feel very sleepy. °· You see red streaks coming from the area. °· Your bone or joint near the area hurts after the skin has healed. °· The area turns darker. °· You have trouble breathing. °Summary °· Dermatitis is redness, soreness, and swelling of the skin. °· Symptoms may occur where the irritant has touched you. °· Treatment may include medicines and skin care. °· If you do not know what caused  your reaction, keep a journal. °· Contact a doctor if your condition gets worse or you have signs of infection. °This information is not intended to replace advice given to you by your health care provider. Make sure you discuss any questions you have with your health care provider. °Document Revised: 05/27/2018 Document Reviewed: 08/20/2017 °Elsevier Patient Education © 2020 [employer] ° °

## 2019-10-11 NOTE — Progress Notes (Signed)
Pls cosign for Depo 40 mg inj.Marland KitchenJohny Chess

## 2019-10-11 NOTE — Progress Notes (Signed)
  Tyler Murphy is a 59 y.o. male with the following history as recorded in [employer]:  Patient Active Problem List   Diagnosis Date Noted  . Cough 09/20/2019  . Vitamin D deficiency 12/04/2018  . Patellofemoral arthritis 12/30/2017  . Right hip pain 12/20/2017  . Lumbar spinal stenosis 12/02/2017  . Bilateral knee pain 12/02/2017  . CKD (chronic kidney disease) stage 3, GFR 30-59 ml/min 11/10/2013  . Increased prostate specific antigen (PSA) velocity 11/10/2013  . Nocturia 11/05/2012  . 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 Encounter for long-term (current) use of high-risk medication 11/01/2010  . Preventative health care 10/26/2010  . Allergic rhinitis 09/21/2007  . GERD 09/21/2007  . 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 HYPERCHOLESTEROLEMIA 11/26/2006  . BACK PAIN, LUMBAR, CHRONIC 11/26/2006    Current Outpatient Medications  Medication Sig Dispense Refill  . Multiple Vitamins-Minerals (MULTIVITAMIN GUMMIES ADULT PO) Take 2 Doses by mouth.    . rosuvastatin (CRESTOR) 10 MG tablet TAKE ONE TABLET BY MOUTH DAILY 90 tablet 3  . tamsulosin (FLOMAX) 0.4 MG CAPS capsule TAKE ONE CAPSULE BY MOUTH DAILY 90 capsule 1   No current facility-administered medications for this visit.    Allergies: Bee venom, Lipitor [atorvastatin], and Lovastatin  Past Medical History:  Diagnosis Date  . ALLERGIC RHINITIS 09/21/2007  . ALLERGY 11/26/2006  . BACK PAIN, LUMBAR, CHRONIC 11/26/2006  . GERD 09/21/2007  . HYPERCHOLESTEROLEMIA 11/26/2006  . 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 OBESITY, MILD 11/26/2006    Past Surgical History:  Procedure Laterality Date  . COLONOSCOPY    . s/p lithotrypsy    . TONSILLECTOMY      Family History  Adopted: Yes    Social History   Tobacco Use  . Smoking status: Never Smoker  . Smokeless tobacco: Never Used  Substance Use Topics  . Alcohol use: No    Subjective:  Exposure to poison ivy on both of his lower legs; was working in his yard last  weekend; is highly allergic; has been using topical Triamcinolone and was able to wear shorts over the weekend which helped; does think the area is improving;    Objective:  Vitals:   10/11/19 0918  BP: 118/72  Pulse: 68  Temp: 98.3 F (36.8 C)  TempSrc: Oral  SpO2: 97%  Weight: 206 lb (93.4 kg)    General: Well developed, well nourished, in no acute distress  Skin : Warm and dry. Erythematous vesicular lesions noted on both lower extremities;  Head: Normocephalic and atraumatic  Lungs: Respirations unlabored;  Musculoskeletal: No deformities; no active joint inflammation  Extremities: No edema, cyanosis, clubbing  Vessels: Symmetric bilaterally  Neurologic: Alert and oriented; speech intact; face symmetrical; moves all extremities well; CNII-XII intact without focal deficit  Assessment:  1. Poison ivy     Plan:  Patient does feel that area is clinically improving; will treat with Depo-Medrol IM 40 mg today and he will continue to use Triamcinolone cream; follow up if symptoms worsen or persist;  This visit occurred during the SARS-CoV-2 public health emergency.  Safety protocols were in place, including screening questions prior to the visit, additional usage of staff PPE, and extensive cleaning of exam room while observing appropriate contact time as indicated for disinfecting solutions.     No follow-ups on file.  No orders of the defined types were placed in this encounter.   Requested Prescriptions    No prescriptions requested or ordered in this encounter

## 2019-11-30 ENCOUNTER — Other Ambulatory Visit: Payer: Self-pay | Admitting: Internal Medicine

## 2019-11-30 NOTE — Telephone Encounter (Signed)
Please refill as per office routine med refill policy (all routine meds refilled for 3 mo or monthly per pt preference up to one year from last visit, then month to month grace period for 3 mo, then further med refills will have to be denied)  

## 2019-12-03 ENCOUNTER — Other Ambulatory Visit (INDEPENDENT_AMBULATORY_CARE_PROVIDER_SITE_OTHER): Payer: Managed Care, Other (non HMO)

## 2019-12-03 DIAGNOSIS — Z0000 Encounter for general adult medical examination without abnormal findings: Secondary | ICD-10-CM | POA: Diagnosis not present

## 2019-12-03 DIAGNOSIS — E559 Vitamin D deficiency, unspecified: Secondary | ICD-10-CM | POA: Diagnosis not present

## 2019-12-03 LAB — URINALYSIS, ROUTINE W REFLEX MICROSCOPIC
Bilirubin Urine: NEGATIVE
Hgb urine dipstick: NEGATIVE
Ketones, ur: NEGATIVE
Leukocytes,Ua: NEGATIVE
Nitrite: NEGATIVE
Specific Gravity, Urine: 1.025 (ref 1.000–1.030)
Total Protein, Urine: NEGATIVE
Urine Glucose: NEGATIVE
Urobilinogen, UA: 0.2 (ref 0.0–1.0)
pH: 6 (ref 5.0–8.0)

## 2019-12-03 LAB — CBC WITH DIFFERENTIAL/PLATELET
Basophils Absolute: 0 10*3/uL (ref 0.0–0.1)
Basophils Relative: 0.5 % (ref 0.0–3.0)
Eosinophils Absolute: 0.1 10*3/uL (ref 0.0–0.7)
Eosinophils Relative: 2.3 % (ref 0.0–5.0)
HCT: 46.1 % (ref 39.0–52.0)
Hemoglobin: 15.8 g/dL (ref 13.0–17.0)
Lymphocytes Relative: 25.7 % (ref 12.0–46.0)
Lymphs Abs: 1.4 10*3/uL (ref 0.7–4.0)
MCHC: 34.3 g/dL (ref 30.0–36.0)
MCV: 89 fl (ref 78.0–100.0)
Monocytes Absolute: 0.5 10*3/uL (ref 0.1–1.0)
Monocytes Relative: 8.8 % (ref 3.0–12.0)
Neutro Abs: 3.5 10*3/uL (ref 1.4–7.7)
Neutrophils Relative %: 62.7 % (ref 43.0–77.0)
Platelets: 152 10*3/uL (ref 150.0–400.0)
RBC: 5.18 Mil/uL (ref 4.22–5.81)
RDW: 13.1 % (ref 11.5–15.5)
WBC: 5.5 10*3/uL (ref 4.0–10.5)

## 2019-12-03 LAB — BASIC METABOLIC PANEL
BUN: 21 mg/dL (ref 6–23)
CO2: 27 mEq/L (ref 19–32)
Calcium: 9 mg/dL (ref 8.4–10.5)
Chloride: 103 mEq/L (ref 96–112)
Creatinine, Ser: 1.24 mg/dL (ref 0.40–1.50)
GFR: 62.6 mL/min (ref >60.00)
Glucose, Bld: 81 mg/dL (ref 70–99)
Potassium: 4 mEq/L (ref 3.5–5.1)
Sodium: 139 mEq/L (ref 135–145)

## 2019-12-03 LAB — HEPATIC FUNCTION PANEL
ALT: 22 U/L (ref 0–53)
AST: 16 U/L (ref 0–37)
Albumin: 4.5 g/dL (ref 3.5–5.2)
Alkaline Phosphatase: 58 U/L (ref 39–117)
Bilirubin, Direct: 0.2 mg/dL (ref 0.0–0.3)
Total Bilirubin: 0.9 mg/dL (ref 0.2–1.2)
Total Protein: 6.6 g/dL (ref 6.0–8.3)

## 2019-12-03 LAB — LIPID PANEL
Cholesterol: 112 mg/dL (ref 0–200)
HDL: 34.1 mg/dL — ABNORMAL LOW (ref >39.00)
LDL Cholesterol: 59 mg/dL (ref 0–99)
NonHDL: 77.82
Total CHOL/HDL Ratio: 3
Triglycerides: 94 mg/dL (ref 0.0–149.0)
VLDL: 18.8 mg/dL (ref 0.0–40.0)

## 2019-12-03 LAB — TSH: TSH: 1.94 u[IU]/mL (ref 0.35–4.50)

## 2019-12-03 LAB — VITAMIN D 25 HYDROXY (VIT D DEFICIENCY, FRACTURES): VITD: 26 ng/mL — ABNORMAL LOW (ref 30.00–100.00)

## 2019-12-03 LAB — PSA: PSA: 4.15 ng/mL — ABNORMAL HIGH (ref 0.10–4.00)

## 2019-12-10 ENCOUNTER — Encounter: Payer: Self-pay | Admitting: Internal Medicine

## 2019-12-10 ENCOUNTER — Ambulatory Visit (INDEPENDENT_AMBULATORY_CARE_PROVIDER_SITE_OTHER): Payer: Managed Care, Other (non HMO) | Admitting: Internal Medicine

## 2019-12-10 ENCOUNTER — Other Ambulatory Visit: Payer: Self-pay

## 2019-12-10 VITALS — BP 130/70 | HR 86 | Temp 98.2°F | Ht 69.0 in | Wt 207.0 lb

## 2019-12-10 DIAGNOSIS — N182 Chronic kidney disease, stage 2 (mild): Secondary | ICD-10-CM | POA: Diagnosis not present

## 2019-12-10 DIAGNOSIS — E559 Vitamin D deficiency, unspecified: Secondary | ICD-10-CM

## 2019-12-10 DIAGNOSIS — N401 Enlarged prostate with lower urinary tract symptoms: Secondary | ICD-10-CM | POA: Diagnosis not present

## 2019-12-10 DIAGNOSIS — Z23 Encounter for immunization: Secondary | ICD-10-CM

## 2019-12-10 DIAGNOSIS — R972 Elevated prostate specific antigen [PSA]: Secondary | ICD-10-CM

## 2019-12-10 DIAGNOSIS — Z0000 Encounter for general adult medical examination without abnormal findings: Secondary | ICD-10-CM | POA: Diagnosis not present

## 2019-12-10 DIAGNOSIS — E7800 Pure hypercholesterolemia, unspecified: Secondary | ICD-10-CM | POA: Diagnosis not present

## 2019-12-10 DIAGNOSIS — Z0001 Encounter for general adult medical examination with abnormal findings: Secondary | ICD-10-CM

## 2019-12-10 DIAGNOSIS — R3914 Feeling of incomplete bladder emptying: Secondary | ICD-10-CM

## 2019-12-10 MED ORDER — ROSUVASTATIN CALCIUM 10 MG PO TABS
10.0000 mg | ORAL_TABLET | Freq: Every day | ORAL | 3 refills | Status: DC
Start: 2019-12-10 — End: 2021-01-08

## 2019-12-10 MED ORDER — TAMSULOSIN HCL 0.4 MG PO CAPS
0.4000 mg | ORAL_CAPSULE | Freq: Every day | ORAL | 3 refills | Status: DC
Start: 2019-12-10 — End: 2021-02-13

## 2019-12-10 NOTE — Assessment & Plan Note (Addendum)
Slight elevation, for f/u psa at 6 mo, also has urology appt for dec2021  I spent 31 minutes in addition to time for CPX wellness examination in preparing to see the patient by review of recent labs, imaging and procedures, obtaining and reviewing separately obtained history, communicating with the patient and family or caregiver, ordering medications, tests or procedures, and documenting clinical information in the EHR including the differential Dx, treatment, and any further evaluation and other management of increased psa, bph, ckd, hld, vit d def

## 2019-12-10 NOTE — Patient Instructions (Addendum)
You had the flu shot today  PLease remember to have your moderna booster shot soon  Ok to increase the flomax to twice per day  Please take OTC Vitamin D3 at 2000 units per day, indefinitely.  Please plan to repeat the PSA at the [facility] lab in 6 months, unless directed otherwise per your Urologist in Dec 2021  Please continue all other medications as before, and refills have been done if requested.  Please have the pharmacy call with any other refills you may need.  Please continue your efforts at being more active, low cholesterol diet, and weight control.  You are otherwise up to date with prevention measures today.  Please keep your appointments with your specialists as you may have planned  Please make an Appointment to return for your 1 year visit, or sooner if needed, with Lab testing by Appointment as well, to be done about 3-5 days before at the [place] (so this is for TWO appointments - please see the scheduling desk as you leave)

## 2019-12-10 NOTE — Progress Notes (Addendum)
Subjective:    Patient ID: Tyler Murphy, male    DOB: 1960-09-18, 59 y.o.   MRN: [id number]  HPI  Here for wellness and f/u;  Overall doing ok;  Pt denies Chest pain, worsening SOB, DOE, wheezing, orthopnea, PND, worsening LE edema, palpitations, dizziness or syncope.  Pt denies neurological change such as new headache, facial or extremity weakness.  Pt denies polydipsia, polyuria, or low sugar symptoms. Pt states overall good compliance with treatment and medications, good tolerability, and has been trying to follow appropriate diet.  Pt denies worsening depressive symptoms, suicidal ideation or panic. No fever, night sweats, wt loss, loss of appetite, or other constitutional symptoms.  Pt states good ability with ADL's, has low fall risk, home safety reviewed and adequate, no other significant changes in hearing or vision, and only occasionally active with exercise. Denies urinary symptoms such as dysuria, frequency, urgency, flank pain, hematuria or n/v, fever, chills., but does have slower stream and incomplete emptying at times.  Not taking VIt D supplement Past Medical History:  Diagnosis Date  . ALLERGIC RHINITIS 09/21/2007  . ALLERGY 11/26/2006  . BACK PAIN, LUMBAR, CHRONIC 11/26/2006  . GERD 09/21/2007  . HYPERCHOLESTEROLEMIA 11/26/2006  . 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 OBESITY, MILD 11/26/2006   Past Surgical History:  Procedure Laterality Date  . COLONOSCOPY    . s/p lithotrypsy    . TONSILLECTOMY      reports that he has never smoked. He has never used smokeless tobacco. He reports that he does not drink alcohol and does not use drugs. family history is not on file. He was adopted. Allergies  Allergen Reactions  . Bee Venom Hives and Itching  . Lipitor [Atorvastatin] Other (See Comments)    myalgia  . Lovastatin     myalgia   Current Outpatient Medications on File Prior to Visit  Medication Sig Dispense Refill  . Multiple Vitamins-Minerals (MULTIVITAMIN GUMMIES ADULT PO)  Take 2 Doses by mouth.     No current facility-administered medications on file prior to visit.   Review of Systems All otherwise neg per pt     Objective:   Physical Exam BP 130/70 (BP Location: Left Arm, Patient Position: Sitting, Cuff Size: Large)   Pulse 86   Temp 98.2 F (36.8 C) (Oral)   Ht 5\' 9"  (1.753 m)   Wt 207 lb (93.9 kg)   SpO2 97%   BMI 30.57 kg/m  VS noted,  Constitutional: Pt appears in NAD HENT: Head: NCAT.  Right Ear: External ear normal.  Left Ear: External ear normal.  Eyes: . Pupils are equal, round, and reactive to light. Conjunctivae and EOM are normal Nose: without d/c or deformity Neck: Neck supple. Gross normal ROM Cardiovascular: Normal rate and regular rhythm.   Pulmonary/Chest: Effort normal and breath sounds without rales or wheezing.  Abd:  Soft, NT, ND, + BS, no organomegaly Neurological: Pt is alert. At baseline orientation, motor grossly intact Skin: Skin is warm. No rashes, other new lesions, no LE edema Psychiatric: Pt behavior is normal without agitation  All otherwise neg per pt Lab Results  Component Value Date   WBC 5.5 12/03/2019   HGB 15.8 12/03/2019   HCT 46.1 12/03/2019   PLT 152.0 12/03/2019   GLUCOSE 81 12/03/2019   CHOL 112 12/03/2019   TRIG 94.0 12/03/2019   HDL 34.10 (L) 12/03/2019   LDLDIRECT 150.3 10/12/2009   LDLCALC 59 12/03/2019   ALT 22 12/03/2019   AST 16 12/03/2019  NA 139 12/03/2019   K 4.0 12/03/2019   CL 103 12/03/2019   CREATININE 1.24 12/03/2019   BUN 21 12/03/2019   CO2 27 12/03/2019   TSH 1.94 12/03/2019   PSA 4.15 (H) 12/03/2019      Assessment & Plan:

## 2019-12-12 ENCOUNTER — Encounter: Payer: Self-pay | Admitting: Internal Medicine

## 2019-12-12 DIAGNOSIS — N400 Enlarged prostate without lower urinary tract symptoms: Secondary | ICD-10-CM | POA: Insufficient documentation

## 2019-12-12 NOTE — Assessment & Plan Note (Signed)
For increased flomax 0.4 bid

## 2019-12-12 NOTE — Assessment & Plan Note (Signed)

## 2019-12-12 NOTE — Addendum Note (Signed)
Addended by: Biagio Borg on: 12/12/2019 02:33 AM   Modules accepted: Orders

## 2019-12-12 NOTE — Assessment & Plan Note (Signed)
stable overall by history and exam, recent data reviewed with pt, and pt to continue medical treatment as before,  to f/u any worsening symptoms or concerns  

## 2019-12-12 NOTE — Assessment & Plan Note (Signed)
For vit d 2000 qd

## 2020-10-24 ENCOUNTER — Other Ambulatory Visit: Payer: Self-pay | Admitting: Urology

## 2020-10-24 DIAGNOSIS — C61 Malignant neoplasm of prostate: Secondary | ICD-10-CM

## 2020-11-09 ENCOUNTER — Telehealth: Payer: Self-pay | Admitting: Internal Medicine

## 2020-11-09 NOTE — Telephone Encounter (Signed)
Patient has cpe scheduled 10/24  Would like for provider to put lab orders in so he can go ahead & do them & have results back by upcoming visit

## 2020-11-09 NOTE — Telephone Encounter (Signed)
Orders already in place - please see orders tab

## 2020-11-10 NOTE — Telephone Encounter (Signed)
No need since the labs work as ordered to both locations is my understanding

## 2020-11-14 ENCOUNTER — Other Ambulatory Visit: Payer: Managed Care, Other (non HMO)

## 2020-12-04 ENCOUNTER — Other Ambulatory Visit (INDEPENDENT_AMBULATORY_CARE_PROVIDER_SITE_OTHER): Payer: 59

## 2020-12-04 DIAGNOSIS — E7800 Pure hypercholesterolemia, unspecified: Secondary | ICD-10-CM

## 2020-12-04 DIAGNOSIS — R972 Elevated prostate specific antigen [PSA]: Secondary | ICD-10-CM

## 2020-12-04 LAB — HEPATIC FUNCTION PANEL
ALT: 27 U/L (ref 0–53)
AST: 19 U/L (ref 0–37)
Albumin: 4.4 g/dL (ref 3.5–5.2)
Alkaline Phosphatase: 60 U/L (ref 39–117)
Bilirubin, Direct: 0.1 mg/dL (ref 0.0–0.3)
Total Bilirubin: 0.5 mg/dL (ref 0.2–1.2)
Total Protein: 6.4 g/dL (ref 6.0–8.3)

## 2020-12-04 LAB — PSA
PSA: 4.65 ng/mL — ABNORMAL HIGH (ref 0.10–4.00)
PSA: 5.28 ng/mL — ABNORMAL HIGH (ref 0.10–4.00)

## 2020-12-04 LAB — URINALYSIS, ROUTINE W REFLEX MICROSCOPIC
Bilirubin Urine: NEGATIVE
Hgb urine dipstick: NEGATIVE
Ketones, ur: NEGATIVE
Leukocytes,Ua: NEGATIVE
Nitrite: NEGATIVE
RBC / HPF: NONE SEEN (ref 0–?)
Specific Gravity, Urine: >=1.03 — AB (ref 1.000–1.030)
Total Protein, Urine: NEGATIVE
Urine Glucose: NEGATIVE
Urobilinogen, UA: 0.2 (ref 0.0–1.0)
pH: 6 (ref 5.0–8.0)

## 2020-12-04 LAB — CBC WITH DIFFERENTIAL/PLATELET
Basophils Absolute: 0 10*3/uL (ref 0.0–0.1)
Basophils Relative: 0.8 % (ref 0.0–3.0)
Eosinophils Absolute: 0.1 10*3/uL (ref 0.0–0.7)
Eosinophils Relative: 2.4 % (ref 0.0–5.0)
HCT: 45.7 % (ref 39.0–52.0)
Hemoglobin: 15.4 g/dL (ref 13.0–17.0)
Lymphocytes Relative: 23.7 % (ref 12.0–46.0)
Lymphs Abs: 1.4 10*3/uL (ref 0.7–4.0)
MCHC: 33.8 g/dL (ref 30.0–36.0)
MCV: 88.8 fl (ref 78.0–100.0)
Monocytes Absolute: 0.5 10*3/uL (ref 0.1–1.0)
Monocytes Relative: 7.7 % (ref 3.0–12.0)
Neutro Abs: 4 10*3/uL (ref 1.4–7.7)
Neutrophils Relative %: 65.4 % (ref 43.0–77.0)
Platelets: 166 10*3/uL (ref 150.0–400.0)
RBC: 5.14 Mil/uL (ref 4.22–5.81)
RDW: 13 % (ref 11.5–15.5)
WBC: 6.1 10*3/uL (ref 4.0–10.5)

## 2020-12-04 LAB — LIPID PANEL
Cholesterol: 121 mg/dL (ref 0–200)
HDL: 33.4 mg/dL — ABNORMAL LOW (ref >39.00)
LDL Cholesterol: 62 mg/dL (ref 0–99)
NonHDL: 88.09
Total CHOL/HDL Ratio: 4
Triglycerides: 129 mg/dL (ref 0.0–149.0)
VLDL: 25.8 mg/dL (ref 0.0–40.0)

## 2020-12-04 LAB — BASIC METABOLIC PANEL
BUN: 18 mg/dL (ref 6–23)
CO2: 27 mEq/L (ref 19–32)
Calcium: 9.1 mg/dL (ref 8.4–10.5)
Chloride: 104 mEq/L (ref 96–112)
Creatinine, Ser: 1.2 mg/dL (ref 0.40–1.50)
GFR: 65.3 mL/min (ref >60.00)
Glucose, Bld: 86 mg/dL (ref 70–99)
Potassium: 4.1 mEq/L (ref 3.5–5.1)
Sodium: 139 mEq/L (ref 135–145)

## 2020-12-04 LAB — TSH: TSH: 2.18 u[IU]/mL (ref 0.35–5.50)

## 2020-12-11 ENCOUNTER — Other Ambulatory Visit: Payer: Self-pay

## 2020-12-11 ENCOUNTER — Encounter: Payer: Self-pay | Admitting: Internal Medicine

## 2020-12-11 ENCOUNTER — Ambulatory Visit (INDEPENDENT_AMBULATORY_CARE_PROVIDER_SITE_OTHER): Payer: 59 | Admitting: Internal Medicine

## 2020-12-11 VITALS — BP 124/86 | HR 82 | Temp 98.6°F | Ht 69.0 in | Wt 212.0 lb

## 2020-12-11 DIAGNOSIS — R972 Elevated prostate specific antigen [PSA]: Secondary | ICD-10-CM | POA: Diagnosis not present

## 2020-12-11 DIAGNOSIS — E538 Deficiency of other specified B group vitamins: Secondary | ICD-10-CM

## 2020-12-11 DIAGNOSIS — Z0000 Encounter for general adult medical examination without abnormal findings: Secondary | ICD-10-CM

## 2020-12-11 DIAGNOSIS — Z0001 Encounter for general adult medical examination with abnormal findings: Secondary | ICD-10-CM

## 2020-12-11 DIAGNOSIS — E559 Vitamin D deficiency, unspecified: Secondary | ICD-10-CM | POA: Diagnosis not present

## 2020-12-11 NOTE — Patient Instructions (Addendum)
Please consider the Novavax covid vaccine  Please continue all other medications as before  Please have the pharmacy call with any other refills you may need.  Please continue your efforts at being more active, low cholesterol diet, and weight control.  You are otherwise up to date with prevention measures today.  Please keep your appointments with your specialists as you may have planned  Please make an Appointment to return for your 1 year visit, or sooner if needed, with Lab testing by Appointment as well, to be done about 3-5 days before at the [place] (so this is for TWO appointments - please see the scheduling desk as you leave)  Due to the ongoing Covid 19 pandemic, our lab now requires an appointment for any labs done at our office.  If you need labs done and do not have an appointment, please call our office ahead of time to schedule before presenting to the lab for your testing.

## 2020-12-11 NOTE — Progress Notes (Signed)
Patient ID: Tyler Murphy, male   DOB: [birth year], 60 y.o.   MRN: [id number]         Chief Complaint:: wellness exam        HPI:  Tyler Murphy is a 60 y.o. male here for wellness exam; declines covid vax, pneumovax, o/w up to date                        Also had prostate biopsy slight abnormal for possible malignancy, due for MRI and repeat biopsy in Nov 2022 with urology Dr Shirley Muscat.  Pt denies chest pain, increased sob or doe, wheezing, orthopnea, PND, increased LE swelling, palpitations, dizziness or syncope.   Pt denies polydipsia, polyuria, or new focal neuro s/s.   Wt up a few lbs with less activity Wt Readings from Last 3 Encounters:  12/11/20 212 lb (96.2 kg)  12/10/19 207 lb (93.9 kg)  10/11/19 206 lb (93.4 kg)   BP Readings from Last 3 Encounters:  12/11/20 124/86  12/10/19 130/70  10/11/19 118/72   Immunization History  Administered Date(s) Administered   Influenza Split 11/01/2010, 11/05/2011   Influenza,inj,Quad PF,6+ Mos 11/05/2012, 11/10/2013, 11/15/2014, 11/26/2016, 12/02/2017, 12/04/2018, 12/10/2019   Influenza-Unspecified 10/29/2020   Moderna Sars-Covid-2 Vaccination 05/11/2019, 06/08/2019   Td 10/17/2008   Tdap 12/04/2018   Zoster Recombinat (Shingrix) 11/26/2016, 02/26/2017   There are no preventive care reminders to display for this patient.     Past Medical History:  Diagnosis Date   ALLERGIC RHINITIS 09/21/2007   ALLERGY 11/26/2006   BACK PAIN, LUMBAR, CHRONIC 11/26/2006   GERD 09/21/2007   HYPERCHOLESTEROLEMIA 11/26/2006   NEPHROLITHIASIS, HX OF 09/21/2007   OBESITY, MILD 11/26/2006   Past Surgical History:  Procedure Laterality Date   COLONOSCOPY     s/p lithotrypsy     TONSILLECTOMY      reports that he has never smoked. He has never used smokeless tobacco. He reports that he does not drink alcohol and does not use drugs. family history is not on file. He was adopted. Allergies  Allergen Reactions   Bee Venom Hives and Itching   Lipitor  [Atorvastatin] Other (See Comments)    myalgia   Lovastatin     myalgia   Current Outpatient Medications on File Prior to Visit  Medication Sig Dispense Refill   levofloxacin (LEVAQUIN) 750 MG tablet Take 750 mg by mouth daily.     Multiple Vitamins-Minerals (MULTIVITAMIN GUMMIES ADULT PO) Take 2 Doses by mouth.     rosuvastatin (CRESTOR) 10 MG tablet Take 1 tablet (10 mg total) by mouth daily. 90 tablet 3   tamsulosin (FLOMAX) 0.4 MG CAPS capsule Take 1 capsule (0.4 mg total) by mouth daily. 180 capsule 3   No current facility-administered medications on file prior to visit.        ROS:  All others reviewed and negative.  Objective        PE:  BP 124/86 (BP Location: Left Arm, Patient Position: Sitting, Cuff Size: Large)   Pulse 82   Temp 98.6 F (37 C) (Oral)   Ht 5\' 9"  (1.753 m)   Wt 212 lb (96.2 kg)   SpO2 97%   BMI 31.31 kg/m                 Constitutional: Pt appears in NAD               HENT: Head: NCAT.  Right Ear: External ear normal.                 Left Ear: External ear normal.                Eyes: . Pupils are equal, round, and reactive to light. Conjunctivae and EOM are normal               Nose: without d/c or deformity               Neck: Neck supple. Gross normal ROM               Cardiovascular: Normal rate and regular rhythm.                 Pulmonary/Chest: Effort normal and breath sounds without rales or wheezing.                Abd:  Soft, NT, ND, + BS, no organomegaly               Neurological: Pt is alert. At baseline orientation, motor grossly intact               Skin: Skin is warm. No rashes, no other new lesions, LE edema - none               Psychiatric: Pt behavior is normal without agitation   Micro: none  Cardiac tracings I have personally interpreted today:  none  Pertinent Radiological findings (summarize): none   Lab Results  Component Value Date   WBC 6.1 12/04/2020   HGB 15.4 12/04/2020   HCT 45.7 12/04/2020    PLT 166.0 12/04/2020   GLUCOSE 86 12/04/2020   CHOL 121 12/04/2020   TRIG 129.0 12/04/2020   HDL 33.40 (L) 12/04/2020   LDLDIRECT 150.3 10/12/2009   LDLCALC 62 12/04/2020   ALT 27 12/04/2020   AST 19 12/04/2020   NA 139 12/04/2020   K 4.1 12/04/2020   CL 104 12/04/2020   CREATININE 1.20 12/04/2020   BUN 18 12/04/2020   CO2 27 12/04/2020   TSH 2.18 12/04/2020   PSA 4.65 (H) 12/04/2020   PSA 5.28 (H) 12/04/2020   Assessment/Plan:  Tyler Murphy is a 60 y.o. White or Caucasian [1] male with  has a past medical history of ALLERGIC RHINITIS (09/21/2007), ALLERGY (11/26/2006), BACK PAIN, LUMBAR, CHRONIC (11/26/2006), GERD (09/21/2007), HYPERCHOLESTEROLEMIA (11/26/2006), NEPHROLITHIASIS, HX OF (09/21/2007), and OBESITY, MILD (11/26/2006).  Preventative health care Age and sex appropriate education and counseling updated with regular exercise and diet Referrals for preventative services - none needed Immunizations addressed - declines covid vax and pneumovax Smoking counseling  - none needed Evidence for depression or other mood disorder - none significant Most recent labs reviewed. I have personally reviewed and have noted: 1) the patient's medical and social history 2) The patient's current medications and supplements 3) The patient's height, weight, and BMI have been recorded in the chart   Vitamin D deficiency Last vitamin D Lab Results  Component Value Date   VD25OH 26.00 (L) 12/03/2019   Low, to start oral replacement   Elevated PSA Asympt,  Lab Results  Component Value Date   PSA 4.65 (H) 12/04/2020   PSA 5.28 (H) 12/04/2020   PSA 4.15 (H) 12/03/2019  overall stable, for contd monitoring and urology f/u as planned  Followup: Return in about 1 year (around 12/11/2021).  Cathlean Cower, MD 12/19/2020 11:28 AM [place]  [facility]

## 2020-12-19 ENCOUNTER — Encounter: Payer: Self-pay | Admitting: Internal Medicine

## 2020-12-19 NOTE — Assessment & Plan Note (Signed)
Last vitamin D Lab Results  Component Value Date   VD25OH 26.00 (L) 12/03/2019   Low, to start oral replacement

## 2020-12-19 NOTE — Assessment & Plan Note (Signed)
Age and sex appropriate education and counseling updated with regular exercise and diet Referrals for preventative services - none needed Immunizations addressed - declines covid vax and pneumovax Smoking counseling  - none needed Evidence for depression or other mood disorder - none significant Most recent labs reviewed. I have personally reviewed and have noted: 1) the patient's medical and social history 2) The patient's current medications and supplements 3) The patient's height, weight, and BMI have been recorded in the chart

## 2020-12-19 NOTE — Assessment & Plan Note (Signed)
Asympt,  Lab Results  Component Value Date   PSA 4.65 (H) 12/04/2020   PSA 5.28 (H) 12/04/2020   PSA 4.15 (H) 12/03/2019  overall stable, for contd monitoring and urology f/u as planned

## 2020-12-25 ENCOUNTER — Other Ambulatory Visit: Payer: Self-pay

## 2020-12-25 ENCOUNTER — Ambulatory Visit
Admission: RE | Admit: 2020-12-25 | Discharge: 2020-12-25 | Disposition: A | Discharge status: Home / Self Care | Payer: 59 | Source: Ambulatory Visit | Attending: Urology | Admitting: Urology

## 2020-12-25 DIAGNOSIS — C61 Malignant neoplasm of prostate: Secondary | ICD-10-CM

## 2020-12-25 MED ORDER — GADOBENATE DIMEGLUMINE 529 MG/ML IV SOLN
20.0000 mL | Freq: Once | INTRAVENOUS | Status: AC | PRN
  Administered 2020-12-25: 20 mL via INTRAVENOUS

## 2021-01-07 ENCOUNTER — Other Ambulatory Visit: Payer: Self-pay | Admitting: Internal Medicine

## 2021-01-07 NOTE — Telephone Encounter (Signed)
Please refill as per office routine med refill policy (all routine meds to be refilled for 3 mo or monthly (per pt preference) up to one year from last visit, then month to month grace period for 3 mo, then further med refills will have to be denied) ? ?

## 2021-02-13 ENCOUNTER — Other Ambulatory Visit: Payer: Self-pay | Admitting: Internal Medicine

## 2021-02-13 NOTE — Telephone Encounter (Signed)
Please refill as per office routine med refill policy (all routine meds to be refilled for 3 mo or monthly (per pt preference) up to one year from last visit, then month to month grace period for 3 mo, then further med refills will have to be denied) ? ?

## 2021-09-12 ENCOUNTER — Other Ambulatory Visit: Payer: Self-pay | Admitting: Internal Medicine

## 2021-09-12 NOTE — Telephone Encounter (Signed)
Please refill as per office routine med refill policy (all routine meds to be refilled for 3 mo or monthly (per pt preference) up to one year from last visit, then month to month grace period for 3 mo, then further med refills will have to be denied) ? ?

## 2021-12-16 ENCOUNTER — Other Ambulatory Visit: Payer: Self-pay | Admitting: Internal Medicine

## 2021-12-16 NOTE — Telephone Encounter (Signed)
Please refill as per office routine med refill policy (all routine meds to be refilled for 3 mo or monthly (per pt preference) up to one year from last visit, then month to month grace period for 3 mo, then further med refills will have to be denied) ? ?

## 2021-12-18 ENCOUNTER — Other Ambulatory Visit (INDEPENDENT_AMBULATORY_CARE_PROVIDER_SITE_OTHER): Payer: 59

## 2021-12-18 ENCOUNTER — Encounter: Payer: 59 | Admitting: Internal Medicine

## 2021-12-18 ENCOUNTER — Other Ambulatory Visit: Payer: 59

## 2021-12-18 DIAGNOSIS — Z0000 Encounter for general adult medical examination without abnormal findings: Secondary | ICD-10-CM | POA: Diagnosis not present

## 2021-12-18 DIAGNOSIS — E559 Vitamin D deficiency, unspecified: Secondary | ICD-10-CM

## 2021-12-18 DIAGNOSIS — E538 Deficiency of other specified B group vitamins: Secondary | ICD-10-CM | POA: Diagnosis not present

## 2021-12-18 LAB — BASIC METABOLIC PANEL
BUN: 22 mg/dL (ref 6–23)
CO2: 25 mEq/L (ref 19–32)
Calcium: 9.3 mg/dL (ref 8.4–10.5)
Chloride: 104 mEq/L (ref 96–112)
Creatinine, Ser: 1.16 mg/dL (ref 0.40–1.50)
GFR: 67.52 mL/min (ref >60.00)
Glucose, Bld: 83 mg/dL (ref 70–99)
Potassium: 4 mEq/L (ref 3.5–5.1)
Sodium: 141 mEq/L (ref 135–145)

## 2021-12-18 LAB — URINALYSIS, ROUTINE W REFLEX MICROSCOPIC
Hgb urine dipstick: NEGATIVE
Ketones, ur: NEGATIVE
Leukocytes,Ua: NEGATIVE
Nitrite: NEGATIVE
Specific Gravity, Urine: >=1.03 — AB (ref 1.000–1.030)
Total Protein, Urine: NEGATIVE
Urine Glucose: NEGATIVE
Urobilinogen, UA: 0.2 (ref 0.0–1.0)
pH: 5.5 (ref 5.0–8.0)

## 2021-12-18 LAB — CBC WITH DIFFERENTIAL/PLATELET
Basophils Absolute: 0 10*3/uL (ref 0.0–0.1)
Basophils Relative: 0.6 % (ref 0.0–3.0)
Eosinophils Absolute: 0.2 10*3/uL (ref 0.0–0.7)
Eosinophils Relative: 2.5 % (ref 0.0–5.0)
HCT: 47.5 % (ref 39.0–52.0)
Hemoglobin: 16.1 g/dL (ref 13.0–17.0)
Lymphocytes Relative: 28.3 % (ref 12.0–46.0)
Lymphs Abs: 1.7 10*3/uL (ref 0.7–4.0)
MCHC: 33.9 g/dL (ref 30.0–36.0)
MCV: 88.8 fl (ref 78.0–100.0)
Monocytes Absolute: 0.5 10*3/uL (ref 0.1–1.0)
Monocytes Relative: 8.1 % (ref 3.0–12.0)
Neutro Abs: 3.7 10*3/uL (ref 1.4–7.7)
Neutrophils Relative %: 60.5 % (ref 43.0–77.0)
Platelets: 161 10*3/uL (ref 150.0–400.0)
RBC: 5.35 Mil/uL (ref 4.22–5.81)
RDW: 13.2 % (ref 11.5–15.5)
WBC: 6.1 10*3/uL (ref 4.0–10.5)

## 2021-12-18 LAB — LIPID PANEL
Cholesterol: 122 mg/dL (ref 0–200)
HDL: 33.7 mg/dL — ABNORMAL LOW (ref >39.00)
LDL Cholesterol: 62 mg/dL (ref 0–99)
NonHDL: 87.81
Total CHOL/HDL Ratio: 4
Triglycerides: 129 mg/dL (ref 0.0–149.0)
VLDL: 25.8 mg/dL (ref 0.0–40.0)

## 2021-12-18 LAB — HEPATIC FUNCTION PANEL
ALT: 26 U/L (ref 0–53)
AST: 18 U/L (ref 0–37)
Albumin: 4.6 g/dL (ref 3.5–5.2)
Alkaline Phosphatase: 66 U/L (ref 39–117)
Bilirubin, Direct: 0.2 mg/dL (ref 0.0–0.3)
Total Bilirubin: 0.8 mg/dL (ref 0.2–1.2)
Total Protein: 7 g/dL (ref 6.0–8.3)

## 2021-12-18 LAB — VITAMIN D 25 HYDROXY (VIT D DEFICIENCY, FRACTURES): VITD: 39.71 ng/mL (ref 30.00–100.00)

## 2021-12-18 LAB — TSH: TSH: 2.4 u[IU]/mL (ref 0.35–5.50)

## 2021-12-18 LAB — PSA: PSA: 4.35 ng/mL — ABNORMAL HIGH (ref 0.10–4.00)

## 2021-12-18 LAB — VITAMIN B12: Vitamin B-12: 775 pg/mL (ref 211–911)

## 2021-12-26 ENCOUNTER — Ambulatory Visit (INDEPENDENT_AMBULATORY_CARE_PROVIDER_SITE_OTHER): Payer: 59

## 2021-12-26 ENCOUNTER — Ambulatory Visit (INDEPENDENT_AMBULATORY_CARE_PROVIDER_SITE_OTHER): Payer: 59 | Admitting: Internal Medicine

## 2021-12-26 VITALS — BP 122/76 | HR 77 | Temp 99.5°F | Ht 69.0 in | Wt 221.0 lb

## 2021-12-26 DIAGNOSIS — Z0001 Encounter for general adult medical examination with abnormal findings: Secondary | ICD-10-CM | POA: Diagnosis not present

## 2021-12-26 DIAGNOSIS — M25562 Pain in left knee: Secondary | ICD-10-CM

## 2021-12-26 DIAGNOSIS — Z23 Encounter for immunization: Secondary | ICD-10-CM

## 2021-12-26 DIAGNOSIS — Z125 Encounter for screening for malignant neoplasm of prostate: Secondary | ICD-10-CM | POA: Diagnosis not present

## 2021-12-26 DIAGNOSIS — E538 Deficiency of other specified B group vitamins: Secondary | ICD-10-CM

## 2021-12-26 DIAGNOSIS — R0689 Other abnormalities of breathing: Secondary | ICD-10-CM | POA: Diagnosis not present

## 2021-12-26 DIAGNOSIS — J309 Allergic rhinitis, unspecified: Secondary | ICD-10-CM

## 2021-12-26 DIAGNOSIS — R972 Elevated prostate specific antigen [PSA]: Secondary | ICD-10-CM

## 2021-12-26 DIAGNOSIS — R739 Hyperglycemia, unspecified: Secondary | ICD-10-CM | POA: Diagnosis not present

## 2021-12-26 DIAGNOSIS — E7800 Pure hypercholesterolemia, unspecified: Secondary | ICD-10-CM

## 2021-12-26 DIAGNOSIS — E559 Vitamin D deficiency, unspecified: Secondary | ICD-10-CM

## 2021-12-26 MED ORDER — TAMSULOSIN HCL 0.4 MG PO CAPS: ORAL_CAPSULE | ORAL | 3 refills | Status: DC

## 2021-12-26 MED ORDER — TRIAMCINOLONE ACETONIDE 55 MCG/ACT NA AERO: 2.0000 | INHALATION_SPRAY | Freq: Every day | NASAL | 12 refills | Status: DC

## 2021-12-26 NOTE — Progress Notes (Signed)
Chief Complaint:: wellness exam and        HPI:  Tyler Murphy is a 61 y.o. male here for wellness exam; due for flu shot; declines covid booster, o/w up to date                        Also folows with urology. S/p prostate Bx and MRI x 1 - neg, and f/u at 6 mo Dr Diona Fanti.  Denies urinary symptoms such as dysuria, frequency, urgency, flank pain, hematuria or n/v, fever, chills.  Pt denies chest pain, increased sob or doe, wheezing, orthopnea, PND, increased LE swelling, palpitations, dizziness or syncope.   Pt denies polydipsia, polyuria, or new focal neuro s/s.   Taking low dose Vit d.  C/o intermittent mild swelling of the hands and feet but no pain.  Pt states wife wants him to mention that he has pauses and abnormal breathing at night with snoring and daytime somnolence.  Also, pt has persistently reucrring pain to anterolat left knee with walking   Also has several nosebleeds mild on the right nares using    Wt Readings from Last 3 Encounters:  12/26/21 221 lb (100.2 kg)  12/11/20 212 lb (96.2 kg)  12/10/19 207 lb (93.9 kg)   BP Readings from Last 3 Encounters:  12/26/21 122/76  12/11/20 124/86  12/10/19 130/70   Immunization History  Administered Date(s) Administered   Influenza Split 11/01/2010, 11/05/2011   Influenza,inj,Quad PF,6+ Mos 11/05/2012, 11/10/2013, 11/15/2014, 11/26/2016, 12/02/2017, 12/04/2018, 12/10/2019, 12/26/2021   Influenza-Unspecified 10/29/2020   Moderna Sars-Covid-2 Vaccination 05/11/2019, 06/08/2019   Td 10/17/2008   Tdap 12/04/2018   Zoster Recombinat (Shingrix) 11/26/2016, 02/26/2017  There are no preventive care reminders to display for this patient.    Past Medical History:  Diagnosis Date   ALLERGIC RHINITIS 09/21/2007   ALLERGY 11/26/2006   BACK PAIN, LUMBAR, CHRONIC 11/26/2006   GERD 09/21/2007   HYPERCHOLESTEROLEMIA 11/26/2006   NEPHROLITHIASIS, HX OF 09/21/2007   OBESITY, MILD 11/26/2006   Past Surgical History:  Procedure Laterality Date    COLONOSCOPY     s/p lithotrypsy     TONSILLECTOMY      reports that he has never smoked. He has never used smokeless tobacco. He reports that he does not drink alcohol and does not use drugs. family history is not on file. He was adopted. Allergies  Allergen Reactions   Bee Venom Hives and Itching   Lipitor [Atorvastatin] Other (See Comments)    myalgia   Lovastatin     myalgia   Current Outpatient Medications on File Prior to Visit  Medication Sig Dispense Refill   Multiple Vitamins-Minerals (MULTIVITAMIN GUMMIES ADULT PO) Take 2 Doses by mouth.     rosuvastatin (CRESTOR) 10 MG tablet TAKE ONE TABLET BY MOUTH DAILY 90 tablet 3   No current facility-administered medications on file prior to visit.        ROS:  All others reviewed and negative.  Objective        PE:  BP 122/76 (BP Location: Right Arm, Patient Position: Sitting, Cuff Size: Large)   Pulse 77   Temp 99.5 F (37.5 C) (Oral)   Ht '5\' 9"'$  (1.753 m)   Wt 221 lb (100.2 kg)   SpO2 97%   BMI 32.64 kg/m                 Constitutional: Pt appears in NAD  HENT: Head: NCAT.                Right Ear: External ear normal.                 Left Ear: External ear normal.                Eyes: . Pupils are equal, round, and reactive to light. Conjunctivae and EOM are normal               Nose: without d/c or deformity               Neck: Neck supple. Gross normal ROM               Cardiovascular: Normal rate and regular rhythm.                 Pulmonary/Chest: Effort normal and breath sounds without rales or wheezing.                Abd:  Soft, NT, ND, + BS, no organomegaly               Neurological: Pt is alert. At baseline orientation, motor grossly intact               Skin: Skin is warm. No rashes, no other new lesions, LE edema - none               Psychiatric: Pt behavior is normal without agitation   Micro: none  Cardiac tracings I have personally interpreted today:  none  Pertinent Radiological  findings (summarize): none   Lab Results  Component Value Date   WBC 6.1 12/18/2021   HGB 16.1 12/18/2021   HCT 47.5 12/18/2021   PLT 161.0 12/18/2021   GLUCOSE 83 12/18/2021   CHOL 122 12/18/2021   TRIG 129.0 12/18/2021   HDL 33.70 (L) 12/18/2021   LDLDIRECT 150.3 10/12/2009   LDLCALC 62 12/18/2021   ALT 26 12/18/2021   AST 18 12/18/2021   NA 141 12/18/2021   K 4.0 12/18/2021   CL 104 12/18/2021   CREATININE 1.16 12/18/2021   BUN 22 12/18/2021   CO2 25 12/18/2021   TSH 2.40 12/18/2021   PSA 4.35 (H) 12/18/2021   Assessment/Plan:  Tyler Murphy is a 61 y.o. White or Caucasian [1] male with  has a past medical history of ALLERGIC RHINITIS (09/21/2007), ALLERGY (11/26/2006), BACK PAIN, LUMBAR, CHRONIC (11/26/2006), GERD (09/21/2007), HYPERCHOLESTEROLEMIA (11/26/2006), NEPHROLITHIASIS, HX OF (09/21/2007), and OBESITY, MILD (11/26/2006).  Encounter for well adult exam with abnormal findings Age and sex appropriate education and counseling updated with regular exercise and diet Referrals for preventative services - none needed Immunizations addressed - for flu shot, declines covid booster o/w up to date Smoking counseling  - none needed Evidence for depression or other mood disorder - none significant Most recent labs reviewed. I have personally reviewed and have noted: 1) the patient's medical and social history 2) The patient's current medications and supplements 3) The patient's height, weight, and BMI have been recorded in the chart   Vitamin D deficiency Last vitamin D Lab Results  Component Value Date   VD25OH 39.71 12/18/2021   Low, to double up on oral replacement Vit D3 to 2000 u qd  HYPERCHOLESTEROLEMIA Lab Results  Component Value Date   LDLCALC 62 12/18/2021   Stable, pt to continue current statin crestor 10 mg qd   Elevated PSA Lab Results  Component Value Date  PSA 4.35 (H) 12/18/2021   PSA 4.65 (H) 12/04/2020   PSA 5.28 (H) 12/04/2020   Stable  overall, asympt, continue to follow  Allergic rhinitis Uncontrolled with recent right nosebleed - for change flonase to nasacort asd,  to f/u any worsening symptoms or concerns  Left knee pain Exam bening, for xray, refer sport medicine  Breathing slowed With pauses at night, snoring and somnolence - can't r/o osa - for pulmonary referral  Followup: Return in about 1 year (around 12/27/2022).  Cathlean Cower, MD 12/28/2021 8:36 PM [facility]

## 2021-12-26 NOTE — Patient Instructions (Addendum)
You had the flu shot today  Ok to change the flonase to OTC Nasacort to hopefully stop the nosebleeds  Ok to double up on the Vit D3 supplement  Please continue all other medications as before, and refills have been done if requested.  Please have the pharmacy call with any other refills you may need.  Please continue your efforts at being more active, low cholesterol diet, and weight control.  You are otherwise up to date with prevention measures today.  Please keep your appointments with your specialists as you may have planned - Urology in 6 mo  You will be contacted regarding the referral for: Pulmonary  Please also stop at the first floor for appt for the left knee  Please go to the XRAY Department in the first floor for the x-ray testing  You will be contacted by phone if any changes need to be made immediately.  Otherwise, you will receive a letter about your results with an explanation, but please check with MyChart first.  Please remember to sign up for MyChart if you have not done so, as this will be important to you in the future with finding out test results, communicating by private email, and scheduling acute appointments online when needed.  Please make an Appointment to return for your 1 year visit, or sooner if needed, with Lab testing by Appointment as well, to be done about 3-5 days before at the [facility] (so this is for TWO appointments - please see the scheduling desk as you leave)

## 2021-12-28 ENCOUNTER — Encounter: Payer: Self-pay | Admitting: Internal Medicine

## 2021-12-28 DIAGNOSIS — R0689 Other abnormalities of breathing: Secondary | ICD-10-CM | POA: Insufficient documentation

## 2021-12-28 DIAGNOSIS — M25562 Pain in left knee: Secondary | ICD-10-CM | POA: Insufficient documentation

## 2021-12-28 NOTE — Assessment & Plan Note (Signed)
Age and sex appropriate education and counseling updated with regular exercise and diet Referrals for preventative services - none needed Immunizations addressed - for flu shot, declines covid booster o/w up to date Smoking counseling  - none needed Evidence for depression or other mood disorder - none significant Most recent labs reviewed. I have personally reviewed and have noted: 1) the patient's medical and social history 2) The patient's current medications and supplements 3) The patient's height, weight, and BMI have been recorded in the chart

## 2021-12-28 NOTE — Assessment & Plan Note (Signed)
Lab Results  Component Value Date   LDLCALC 62 12/18/2021   Stable, pt to continue current statin crestor 10 mg qd

## 2021-12-28 NOTE — Assessment & Plan Note (Signed)
Exam bening, for xray, refer sport medicine

## 2021-12-28 NOTE — Assessment & Plan Note (Signed)
With pauses at night, snoring and somnolence - can't r/o osa - for pulmonary referral

## 2021-12-28 NOTE — Assessment & Plan Note (Signed)
Last vitamin D Lab Results  Component Value Date   VD25OH 39.71 12/18/2021   Low, to double up on oral replacement Vit D3 to 2000 u qd

## 2021-12-28 NOTE — Assessment & Plan Note (Signed)
Uncontrolled with recent right nosebleed - for change flonase to nasacort asd,  to f/u any worsening symptoms or concerns

## 2021-12-28 NOTE — Assessment & Plan Note (Signed)
Lab Results  Component Value Date   PSA 4.35 (H) 12/18/2021   PSA 4.65 (H) 12/04/2020   PSA 5.28 (H) 12/04/2020   Stable overall, asympt, continue to follow

## 2022-02-12 ENCOUNTER — Institutional Professional Consult (permissible substitution): Payer: 59 | Admitting: Adult Health

## 2022-02-27 ENCOUNTER — Encounter: Payer: Self-pay | Admitting: Adult Health

## 2022-02-27 ENCOUNTER — Ambulatory Visit: Payer: Managed Care, Other (non HMO) | Admitting: Adult Health

## 2022-02-27 VITALS — BP 120/80 | HR 98 | Temp 98.2°F | Ht 69.0 in | Wt 222.2 lb

## 2022-02-27 DIAGNOSIS — R0683 Snoring: Secondary | ICD-10-CM | POA: Diagnosis not present

## 2022-02-27 NOTE — Addendum Note (Signed)
Addended by: Vanessa Barbara on: 02/27/2022 08:59 AM   Modules accepted: Orders

## 2022-02-27 NOTE — Patient Instructions (Signed)
Set up for home sleep study.  Healthy sleep regimen  Do not drive if sleepy work on healthy weight loss.  Follow up in 3 months to discuss sleep study results and treatment plan

## 2022-02-27 NOTE — Progress Notes (Addendum)
$'@Patient'i$  ID: Tyler Murphy, male    DOB: 11/28/1960, 62 y.o.   MRN: [id number]  Chief Complaint  Patient presents with   Consult    Referring provider: Biagio Borg, MD  HPI: 62 year old seen for sleep consult February 27, 2022 for snoring and witnessed apneic events   TEST/EVENTS :   02/27/2022 Sleep consult  Patient presents for a sleep consult today.  Kindly referred by primary care provider Dr. Jenny Reichmann.  Patient complains of snoring, restless sleep, daytime sleepiness.  Witnessed apneic events from spouse.  Has never had a sleep study before.  Typically goes to bed about 9 to 10 PM.  Does not take long to go to sleep.  Is up maybe once or twice at nighttime.  Gets up about 5 to 6 AM.  Weight is up 20 pounds over the last 2 years.  Current weight is at 222 pounds with a BMI at 32.  Patient does not nap. No symptoms suspicious for cataplexy or sleep paralysis.  No history of congestive heart failure or stroke.  Caffeine intake is minimal. .  Epworth score is  Says he is not sleepy during daytime but can fall asleep easily if he sits down to rest or watch TV  Epworth is 9 out of 24 .   Medical history significant for hyperlipidemia and chronic allergies, BPH, osteoarthritis, stage II chronic kidney disease, kidney stones, GERD  Social history patient is married.  He works as a Government social research officer.  Has adult children.  He is a never smoker.  No alcohol or drug use. Sedentary , no exercise .   Family history patient adopted.    Past Surgical History:  Procedure Laterality Date   COLONOSCOPY     s/p lithotrypsy     TONSILLECTOMY        Allergies  Allergen Reactions   Bee Venom Hives and Itching   Lipitor [Atorvastatin] Other (See Comments)    myalgia   Lovastatin     myalgia    Immunization History  Administered Date(s) Administered   Influenza Split 11/01/2010, 11/05/2011   Influenza,inj,Quad PF,6+ Mos 11/05/2012, 11/10/2013, 11/15/2014, 11/26/2016, 12/02/2017,  12/04/2018, 12/10/2019, 12/26/2021   Influenza-Unspecified 10/29/2020   Moderna Sars-Covid-2 Vaccination 05/11/2019, 06/08/2019   Td 10/17/2008   Tdap 12/04/2018   Zoster Recombinat (Shingrix) 11/26/2016, 02/26/2017    Past Medical History:  Diagnosis Date   ALLERGIC RHINITIS 09/21/2007   ALLERGY 11/26/2006   BACK PAIN, LUMBAR, CHRONIC 11/26/2006   GERD 09/21/2007   HYPERCHOLESTEROLEMIA 11/26/2006   NEPHROLITHIASIS, HX OF 09/21/2007   OBESITY, MILD 11/26/2006    Tobacco History: Social History   Tobacco Use  Smoking Status Never  Smokeless Tobacco Never   Counseling given: Not Answered   Outpatient Medications Prior to Visit  Medication Sig Dispense Refill   b complex vitamins capsule Take 1 capsule by mouth daily.     Multiple Vitamins-Minerals (MULTIVITAMIN GUMMIES ADULT PO) Take 2 Doses by mouth.     rosuvastatin (CRESTOR) 10 MG tablet TAKE ONE TABLET BY MOUTH DAILY 90 tablet 3   tamsulosin (FLOMAX) 0.4 MG CAPS capsule TAKE 1 CAPSULE BY MOUTH DAILY 90 capsule 3   triamcinolone (NASACORT) 55 MCG/ACT AERO nasal inhaler Place 2 sprays into the nose daily. (Patient not taking: Reported on 02/27/2022) 1 each 12   No facility-administered medications prior to visit.     Review of Systems:   Constitutional:   No  weight loss, night sweats,  Fevers, chills, fatigue, or  lassitude.  HEENT:   No headaches,  Difficulty swallowing,  Tooth/dental problems, or  Sore throat,                No sneezing, itching, ear ache, nasal congestion, post nasal drip,   CV:  No chest pain,  Orthopnea, PND, swelling in lower extremities, anasarca, dizziness, palpitations, syncope.   GI  No heartburn, indigestion, abdominal pain, nausea, vomiting, diarrhea, change in bowel habits, loss of appetite, bloody stools.   Resp: No shortness of breath with exertion or at rest.  No excess mucus, no productive cough,  No non-productive cough,  No coughing up of blood.  No change in color of mucus.  No wheezing.   No chest wall deformity  Skin: no rash or lesions.  GU: no dysuria, change in color of urine, no urgency or frequency.  No flank pain, no hematuria   MS:  No joint pain or swelling.  No decreased range of motion.  No back pain.    Physical Exam  BP 120/80 (BP Location: Left Arm, Patient Position: Sitting, Cuff Size: Large)   Pulse 98   Temp 98.2 F (36.8 C) (Oral)   Ht '5\' 9"'$  (1.753 m)   Wt 222 lb 3.2 oz (100.8 kg)   SpO2 97%   BMI 32.81 kg/m   GEN: A/Ox3; pleasant , NAD, well nourished    HEENT:  [place]/AT,   NOSE-clear, THROAT-clear, no lesions, no postnasal drip or exudate noted. Class 3 MP airway   NECK:  Supple w/ fair ROM; no JVD; normal carotid impulses w/o bruits; no thyromegaly or nodules palpated; no lymphadenopathy.    RESP  Clear  P & A; w/o, wheezes/ rales/ or rhonchi. no accessory muscle use, no dullness to percussion  CARD:  RRR, no m/r/g, no peripheral edema, pulses intact, no cyanosis or clubbing.  GI:   Soft & nt; nml bowel sounds; no organomegaly or masses detected.   Musco: Warm bil, no deformities or joint swelling noted.   Neuro: alert, no focal deficits noted.    Skin: Warm, no lesions or rashes    Lab Results:  CBC    Component Value Date/Time   WBC 6.1 12/18/2021 0736   RBC 5.35 12/18/2021 0736   HGB 16.1 12/18/2021 0736   HCT 47.5 12/18/2021 0736   PLT 161.0 12/18/2021 0736   MCV 88.8 12/18/2021 0736   MCHC 33.9 12/18/2021 0736   RDW 13.2 12/18/2021 0736   LYMPHSABS 1.7 12/18/2021 0736   MONOABS 0.5 12/18/2021 0736   EOSABS 0.2 12/18/2021 0736   BASOSABS 0.0 12/18/2021 0736    BMET    Component Value Date/Time   NA 141 12/18/2021 0736   K 4.0 12/18/2021 0736   CL 104 12/18/2021 0736   CO2 25 12/18/2021 0736   GLUCOSE 83 12/18/2021 0736   BUN 22 12/18/2021 0736   CREATININE 1.16 12/18/2021 0736   CALCIUM 9.3 12/18/2021 0736   GFRNONAA 76.83 10/12/2009 0737   GFRAA 92 09/16/2007 0719    BNP No results found for:  "BNP"  ProBNP No results found for: "PROBNP"  Imaging: No results found.        No data to display          No results found for: "NITRICOXIDE"      Assessment & Plan:   Snoring Snoring and witnessed apneic events suspicious for underlying sleep apnea.  Patient scheduled on sleep apnea given We will set up for home sleep study - discussed how weight can  impact sleep and risk for sleep disordered breathing - discussed options to assist with weight loss: combination of diet modification, cardiovascular and strength training exercises   - had an extensive discussion regarding the adverse health consequences related to untreated sleep disordered breathing - specifically discussed the risks for hypertension, coronary artery disease, cardiac dysrhythmias, cerebrovascular disease, and diabetes - lifestyle modification discussed   - discussed how sleep disruption can increase risk of accidents, particularly when driving - safe driving practices were discussed   Plan  Patient Instructions  Set up for home sleep study.  Healthy sleep regimen  Do not drive if sleepy work on healthy weight loss.  Follow up in 3 months to discuss sleep study results and treatment plan       Rexene Edison, NP 02/27/2022

## 2022-02-27 NOTE — Assessment & Plan Note (Signed)
Snoring and witnessed apneic events suspicious for underlying sleep apnea.  Patient scheduled on sleep apnea given We will set up for home sleep study - discussed how weight can impact sleep and risk for sleep disordered breathing - discussed options to assist with weight loss: combination of diet modification, cardiovascular and strength training exercises   - had an extensive discussion regarding the adverse health consequences related to untreated sleep disordered breathing - specifically discussed the risks for hypertension, coronary artery disease, cardiac dysrhythmias, cerebrovascular disease, and diabetes - lifestyle modification discussed   - discussed how sleep disruption can increase risk of accidents, particularly when driving - safe driving practices were discussed   Plan  Patient Instructions  Set up for home sleep study.  Healthy sleep regimen  Do not drive if sleepy work on healthy weight loss.  Follow up in 3 months to discuss sleep study results and treatment plan

## 2022-03-08 ENCOUNTER — Telehealth: Payer: Self-pay | Admitting: Adult Health

## 2022-03-08 NOTE — Telephone Encounter (Signed)
Pt called the office stating that he had a consult appt 1/10. When he came in for his appt, insurance was verified and he also paid a copay of $50.  Pt said he just received notice that $25 was due. Pt wants to know if he indeed does owe $25, if the copay was not taken when he was here for his appt thinking that he had paid for it, or if he owes an additional $25 with what he had already paid, or if this was just a mistake.  Mel Almond, is there any way you can look into this for [place] and pt?

## 2022-03-08 NOTE — Telephone Encounter (Signed)
I do not see a balance on the account for $25- transferred patient to the billing department. Nothing further needed at this time.

## 2022-03-14 ENCOUNTER — Telehealth: Payer: Self-pay | Admitting: Adult Health

## 2022-03-14 NOTE — Telephone Encounter (Signed)
Called and spoke with patient and he is asking if we know if his insurance will cover his home sleep study.   He states he is wanting to know before we call him for his sleep study if possible.   Please advise

## 2022-03-14 NOTE — Telephone Encounter (Signed)
PT wondering if ins has responded for Cpap supplies since sleep study. Please call to advise @ [phone]

## 2022-03-21 ENCOUNTER — Other Ambulatory Visit: Payer: Self-pay | Admitting: Internal Medicine

## 2022-03-21 NOTE — Telephone Encounter (Signed)
Please refill as per office routine med refill policy (all routine meds to be refilled for 3 mo or monthly (per pt preference) up to one year from last visit, then month to month grace period for 3 mo, then further med refills will have to be denied)

## 2022-04-05 ENCOUNTER — Ambulatory Visit: Payer: Managed Care, Other (non HMO)

## 2022-04-05 ENCOUNTER — Telehealth: Payer: Self-pay | Admitting: Internal Medicine

## 2022-04-05 DIAGNOSIS — G4733 Obstructive sleep apnea (adult) (pediatric): Secondary | ICD-10-CM

## 2022-04-05 DIAGNOSIS — R0683 Snoring: Secondary | ICD-10-CM

## 2022-04-05 NOTE — Telephone Encounter (Signed)
[employer] called on behalf of patient, states he would like his tamsulosin 0.46m and rosuvastatin 153msent through [facility], both as a 90 day supply.

## 2022-04-08 MED ORDER — ROSUVASTATIN CALCIUM 10 MG PO TABS: 10.0000 mg | ORAL_TABLET | Freq: Every day | ORAL | 2 refills | Status: DC

## 2022-04-08 MED ORDER — TAMSULOSIN HCL 0.4 MG PO CAPS: ORAL_CAPSULE | ORAL | 2 refills | Status: DC

## 2022-04-08 NOTE — Telephone Encounter (Signed)
Done erx 

## 2022-04-08 NOTE — Telephone Encounter (Signed)
Ok done erx 

## 2022-04-09 DIAGNOSIS — G4733 Obstructive sleep apnea (adult) (pediatric): Secondary | ICD-10-CM

## 2022-04-22 ENCOUNTER — Encounter: Payer: Self-pay | Admitting: Adult Health

## 2022-04-22 ENCOUNTER — Ambulatory Visit: Payer: Managed Care, Other (non HMO) | Admitting: Adult Health

## 2022-04-22 VITALS — BP 140/80 | HR 89 | Temp 98.4°F | Ht 70.0 in | Wt 219.0 lb

## 2022-04-22 DIAGNOSIS — G4733 Obstructive sleep apnea (adult) (pediatric): Secondary | ICD-10-CM

## 2022-04-22 NOTE — Addendum Note (Signed)
Addended by: Vanessa Barbara on: 04/22/2022 10:16 AM   Modules accepted: Orders

## 2022-04-22 NOTE — Patient Instructions (Addendum)
Refer to Dr. Ron Parker in Orthodontics for oral appliance for sleep apnea - [phone] Healthy sleep regimen  Do not drive if sleepy Work on healthy weight loss.  Follow up in 6 months and As needed

## 2022-04-22 NOTE — Progress Notes (Signed)
Is Tyler Murphy  '@Patient'$  ID: Eugenie Birks, male    DOB: Jun 24, 1960, 62 y.o.   MRN: [id number]  Chief Complaint  Patient presents with   Follow-up    Referring provider: Biagio Borg, MD  HPI: 62 year old male seen for sleep consult February 27, 2022 for snoring and witnessed apneic events found to have mild to moderate sleep apnea  TEST/EVENTS :   04/22/2022 Follow up: OSA  Patient presents for a 80-monthfollow-up.  Patient was seen last visit for sleep consult for snoring and witnessed apneic events.  He was set up for home sleep study that was done on April 10, 2022 that showed mild to moderate sleep apnea.  We discussed the sleep study results in detail.  Went over treatment options including weight loss, oral appliance and CPAP therapy.  Patient like to proceed with oral appliance   Allergies  Allergen Reactions   Bee Venom Hives and Itching   Lipitor [Atorvastatin] Other (See Comments)    myalgia   Lovastatin     myalgia    Immunization History  Administered Date(s) Administered   Influenza Split 11/01/2010, 11/05/2011   Influenza,inj,Quad PF,6+ Mos 11/05/2012, 11/10/2013, 11/15/2014, 11/26/2016, 12/02/2017, 12/04/2018, 12/10/2019, 12/26/2021   Influenza-Unspecified 10/29/2020   Moderna Sars-Covid-2 Vaccination 05/11/2019, 06/08/2019   Td 10/17/2008   Tdap 12/04/2018   Zoster Recombinat (Shingrix) 11/26/2016, 02/26/2017    Past Medical History:  Diagnosis Date   ALLERGIC RHINITIS 09/21/2007   ALLERGY 11/26/2006   BACK PAIN, LUMBAR, CHRONIC 11/26/2006   GERD 09/21/2007   HYPERCHOLESTEROLEMIA 11/26/2006   NEPHROLITHIASIS, HX OF 09/21/2007   OBESITY, MILD 11/26/2006    Tobacco History: Social History   Tobacco Use  Smoking Status Never  Smokeless Tobacco Never   Counseling given: Not Answered   Outpatient Medications Prior to Visit  Medication Sig Dispense Refill   b complex vitamins capsule Take 1 capsule by mouth daily.     Multiple Vitamins-Minerals  (MULTIVITAMIN GUMMIES ADULT PO) Take 2 Doses by mouth.     rosuvastatin (CRESTOR) 10 MG tablet Take 1 tablet (10 mg total) by mouth daily. 90 tablet 2   tamsulosin (FLOMAX) 0.4 MG CAPS capsule TAKE 1 CAPSULE BY MOUTH DAILY 90 capsule 2   No facility-administered medications prior to visit.     Review of Systems:   Constitutional:   No  weight loss, night sweats,  Fevers, chills, fatigue, or  lassitude.  HEENT:   No headaches,  Difficulty swallowing,  Tooth/dental problems, or  Sore throat,                No sneezing, itching, ear ache, nasal congestion, post nasal drip,   CV:  No chest pain,  Orthopnea, PND, swelling in lower extremities, anasarca, dizziness, palpitations, syncope.   GI  No heartburn, indigestion, abdominal pain, nausea, vomiting, diarrhea, change in bowel habits, loss of appetite, bloody stools.   Resp: No shortness of breath with exertion or at rest.  No excess mucus, no productive cough,  No non-productive cough,  No coughing up of blood.  No change in color of mucus.  No wheezing.  No chest wall deformity  Skin: no rash or lesions.  GU: no dysuria, change in color of urine, no urgency or frequency.  No flank pain, no hematuria   MS:  No joint pain or swelling.  No decreased range of motion.  No back pain.    Physical Exam  BP (!) 140/80 (BP Location: Left Arm, Patient Position: Sitting, Cuff Size:  Large)   Pulse 89   Temp 98.4 F (36.9 C) (Oral)   Ht '5\' 10"'$  (1.778 m)   Wt 219 lb (99.3 kg)   SpO2 98%   BMI 31.42 kg/m   GEN: A/Ox3; pleasant , NAD, well nourished    HEENT:  [place]/AT,  NOSE-clear, THROAT-clear, no lesions, no postnasal drip or exudate noted.  Class III-IV MP airway  NECK:  Supple w/ fair ROM; no JVD; normal carotid impulses w/o bruits; no thyromegaly or nodules palpated; no lymphadenopathy.    RESP  Clear  P & A; w/o, wheezes/ rales/ or rhonchi. no accessory muscle use, no dullness to percussion  CARD:  RRR, no m/r/g, no peripheral  edema, pulses intact, no cyanosis or clubbing.  GI:   Soft & nt; nml bowel sounds; no organomegaly or masses detected.   Musco: Warm bil, no deformities or joint swelling noted.   Neuro: alert, no focal deficits noted.    Skin: Warm, no lesions or rashes    Lab Results:   BMET   BNP No results found for: "BNP"  ProBNP No results found for: "PROBNP"  Imaging: No results found.        No data to display          No results found for: "NITRICOXIDE"      Assessment & Plan:   OSA (obstructive sleep apnea) Mild to moderate obstructive sleep apnea-we discussed his sleep study results in detail.  Patient education was given.  Went over treatment options including weight loss, oral appliance and CPAP.  Patient would like to proceed with oral appliance.  Plan  Patient Instructions  Refer to Dr. Ron Parker in Orthodontics for oral appliance for sleep apnea - [phone] Healthy sleep regimen  Do not drive if sleepy Work on healthy weight loss.  Follow up in 6 months and As needed        Rexene Edison, NP 04/22/2022

## 2022-04-22 NOTE — Progress Notes (Signed)
Reviewed and agree with assessment/plan.   Chesley Mires, MD [facility] 04/22/2022, 10:24 AM Pager:  [phone]

## 2022-04-22 NOTE — Assessment & Plan Note (Signed)
Mild to moderate obstructive sleep apnea-we discussed his sleep study results in detail.  Patient education was given.  Went over treatment options including weight loss, oral appliance and CPAP.  Patient would like to proceed with oral appliance.  Plan  Patient Instructions  Refer to Dr. Ron Parker in Orthodontics for oral appliance for sleep apnea - [phone] Healthy sleep regimen  Do not drive if sleepy Work on healthy weight loss.  Follow up in 6 months and As needed

## 2022-04-26 ENCOUNTER — Ambulatory Visit: Payer: Managed Care, Other (non HMO) | Admitting: Internal Medicine

## 2022-04-26 ENCOUNTER — Encounter: Payer: Self-pay | Admitting: Internal Medicine

## 2022-04-26 VITALS — BP 130/80 | HR 106 | Temp 98.6°F | Ht 70.0 in | Wt 216.0 lb

## 2022-04-26 DIAGNOSIS — E538 Deficiency of other specified B group vitamins: Secondary | ICD-10-CM

## 2022-04-26 DIAGNOSIS — E559 Vitamin D deficiency, unspecified: Secondary | ICD-10-CM | POA: Diagnosis not present

## 2022-04-26 DIAGNOSIS — R251 Tremor, unspecified: Secondary | ICD-10-CM

## 2022-04-26 DIAGNOSIS — R739 Hyperglycemia, unspecified: Secondary | ICD-10-CM

## 2022-04-26 DIAGNOSIS — Z125 Encounter for screening for malignant neoplasm of prostate: Secondary | ICD-10-CM

## 2022-04-26 DIAGNOSIS — R361 Hematospermia: Secondary | ICD-10-CM | POA: Diagnosis not present

## 2022-04-26 DIAGNOSIS — R972 Elevated prostate specific antigen [PSA]: Secondary | ICD-10-CM | POA: Diagnosis not present

## 2022-04-26 DIAGNOSIS — E7800 Pure hypercholesterolemia, unspecified: Secondary | ICD-10-CM

## 2022-04-26 LAB — URINALYSIS, ROUTINE W REFLEX MICROSCOPIC
Bilirubin Urine: NEGATIVE
Hgb urine dipstick: NEGATIVE
Ketones, ur: NEGATIVE
Leukocytes,Ua: NEGATIVE
Nitrite: NEGATIVE
Specific Gravity, Urine: >=1.03 — AB (ref 1.000–1.030)
Total Protein, Urine: NEGATIVE
Urine Glucose: NEGATIVE
Urobilinogen, UA: 0.2 (ref 0.0–1.0)
pH: 6 (ref 5.0–8.0)

## 2022-04-26 LAB — CBC WITH DIFFERENTIAL/PLATELET
Basophils Absolute: 0.1 10*3/uL (ref 0.0–0.1)
Basophils Relative: 0.8 % (ref 0.0–3.0)
Eosinophils Absolute: 0.1 10*3/uL (ref 0.0–0.7)
Eosinophils Relative: 0.8 % (ref 0.0–5.0)
HCT: 45.6 % (ref 39.0–52.0)
Hemoglobin: 15.7 g/dL (ref 13.0–17.0)
Lymphocytes Relative: 16.1 % (ref 12.0–46.0)
Lymphs Abs: 1.1 10*3/uL (ref 0.7–4.0)
MCHC: 34.5 g/dL (ref 30.0–36.0)
MCV: 87.5 fl (ref 78.0–100.0)
Monocytes Absolute: 1 10*3/uL (ref 0.1–1.0)
Monocytes Relative: 13.5 % — ABNORMAL HIGH (ref 3.0–12.0)
Neutro Abs: 4.9 10*3/uL (ref 1.4–7.7)
Neutrophils Relative %: 68.8 % (ref 43.0–77.0)
Platelets: 160 10*3/uL (ref 150.0–400.0)
RBC: 5.21 Mil/uL (ref 4.22–5.81)
RDW: 13.3 % (ref 11.5–15.5)
WBC: 7.1 10*3/uL (ref 4.0–10.5)

## 2022-04-26 LAB — HEPATIC FUNCTION PANEL
ALT: 28 U/L (ref 0–53)
AST: 22 U/L (ref 0–37)
Albumin: 4.3 g/dL (ref 3.5–5.2)
Alkaline Phosphatase: 73 U/L (ref 39–117)
Bilirubin, Direct: 0.1 mg/dL (ref 0.0–0.3)
Total Bilirubin: 0.6 mg/dL (ref 0.2–1.2)
Total Protein: 6.7 g/dL (ref 6.0–8.3)

## 2022-04-26 LAB — LIPID PANEL
Cholesterol: 115 mg/dL (ref 0–200)
HDL: 31.2 mg/dL — ABNORMAL LOW (ref >39.00)
LDL Cholesterol: 45 mg/dL (ref 0–99)
NonHDL: 83.39
Total CHOL/HDL Ratio: 4
Triglycerides: 194 mg/dL — ABNORMAL HIGH (ref 0.0–149.0)
VLDL: 38.8 mg/dL (ref 0.0–40.0)

## 2022-04-26 LAB — BASIC METABOLIC PANEL
BUN: 23 mg/dL (ref 6–23)
CO2: 27 mEq/L (ref 19–32)
Calcium: 9.4 mg/dL (ref 8.4–10.5)
Chloride: 104 mEq/L (ref 96–112)
Creatinine, Ser: 1.21 mg/dL (ref 0.40–1.50)
GFR: 64.03 mL/min (ref >60.00)
Glucose, Bld: 82 mg/dL (ref 70–99)
Potassium: 4 mEq/L (ref 3.5–5.1)
Sodium: 138 mEq/L (ref 135–145)

## 2022-04-26 LAB — HEMOGLOBIN A1C: Hgb A1c MFr Bld: 5.6 % (ref 4.6–6.5)

## 2022-04-26 NOTE — Progress Notes (Unsigned)
Patient ID: Tyler Murphy, male   DOB: 03/08/1960, 62 y.o.   MRN: [id number]        Chief Complaint: follow up left hand tremor, low vit d, elevated psa, blood in semen, hld       HPI:  Tyler Murphy is a 62 y.o. male here with c/o new mild intermittent left hand shaking, has been reading on internet, was adopted so not able to say if familial; no shaking tremor today.  Denies urinary symptoms such as dysuria, frequency, urgency, flank pain, hematuria or n/v, fever, chills.  Does have blood in semen recenlty, wondering if this needs further eval.         Wt Readings from Last 3 Encounters:  04/26/22 216 lb (98 kg)  04/22/22 219 lb (99.3 kg)  02/27/22 222 lb 3.2 oz (100.8 kg)   BP Readings from Last 3 Encounters:  04/26/22 130/80  04/22/22 (!) 140/80  02/27/22 120/80         Past Medical History:  Diagnosis Date   ALLERGIC RHINITIS 09/21/2007   ALLERGY 11/26/2006   BACK PAIN, LUMBAR, CHRONIC 11/26/2006   GERD 09/21/2007   HYPERCHOLESTEROLEMIA 11/26/2006   NEPHROLITHIASIS, HX OF 09/21/2007   OBESITY, MILD 11/26/2006   Past Surgical History:  Procedure Laterality Date   COLONOSCOPY     s/p lithotrypsy     TONSILLECTOMY      reports that he has never smoked. He has never used smokeless tobacco. He reports that he does not drink alcohol and does not use drugs. family history is not on file. He was adopted. Allergies  Allergen Reactions   Bee Venom Hives and Itching   Lipitor [Atorvastatin] Other (See Comments)    myalgia   Lovastatin     myalgia   Current Outpatient Medications on File Prior to Visit  Medication Sig Dispense Refill   b complex vitamins capsule Take 1 capsule by mouth daily.     Multiple Vitamins-Minerals (MULTIVITAMIN GUMMIES ADULT PO) Take 2 Doses by mouth.     rosuvastatin (CRESTOR) 10 MG tablet Take 1 tablet (10 mg total) by mouth daily. 90 tablet 2   tamsulosin (FLOMAX) 0.4 MG CAPS capsule TAKE 1 CAPSULE BY MOUTH DAILY 90 capsule 2   No current  facility-administered medications on file prior to visit.        ROS:  All others reviewed and negative.  Objective        PE:  BP 130/80   Pulse (!) 106   Temp 98.6 F (37 C) (Oral)   Ht '5\' 10"'$  (1.778 m)   Wt 216 lb (98 kg)   SpO2 96%   BMI 30.99 kg/m                 Constitutional: Pt appears in NAD               HENT: Head: NCAT.                Right Ear: External ear normal.                 Left Ear: External ear normal.                Eyes: . Pupils are equal, round, and reactive to light. Conjunctivae and EOM are normal               Nose: without d/c or deformity  Neck: Neck supple. Gross normal ROM               Cardiovascular: Normal rate and regular rhythm.                 Pulmonary/Chest: Effort normal and breath sounds without rales or wheezing.                Abd:  Soft, NT, ND, + BS, no organomegaly               Neurological: Pt is alert. At baseline orientation, motor grossly intact               Skin: Skin is warm. No rashes, no other new lesions, LE edema - none               Psychiatric: Pt behavior is normal without agitation   Micro: none  Cardiac tracings I have personally interpreted today:  none  Pertinent Radiological findings (summarize): none   Lab Results  Component Value Date   WBC 7.1 04/26/2022   HGB 15.7 04/26/2022   HCT 45.6 04/26/2022   PLT 160.0 04/26/2022   GLUCOSE 82 04/26/2022   CHOL 115 04/26/2022   TRIG 194.0 (H) 04/26/2022   HDL 31.20 (L) 04/26/2022   LDLDIRECT 150.3 10/12/2009   LDLCALC 45 04/26/2022   ALT 28 04/26/2022   AST 22 04/26/2022   NA 138 04/26/2022   K 4.0 04/26/2022   CL 104 04/26/2022   CREATININE 1.21 04/26/2022   BUN 23 04/26/2022   CO2 27 04/26/2022   TSH 1.93 04/26/2022   PSA 5.21 (H) 04/26/2022   HGBA1C 5.6 04/26/2022   Assessment/Plan:  Tyler Murphy is a 62 y.o. White or Caucasian [1] male with  has a past medical history of ALLERGIC RHINITIS (09/21/2007), ALLERGY (11/26/2006), BACK  PAIN, LUMBAR, CHRONIC (11/26/2006), GERD (09/21/2007), HYPERCHOLESTEROLEMIA (11/26/2006), NEPHROLITHIASIS, HX OF (09/21/2007), and OBESITY, MILD (11/26/2006).  HYPERCHOLESTEROLEMIA Lab Results  Component Value Date   LDLCALC 45 04/26/2022   Stable, pt to continue current statin crestor 10 mg qd   Vitamin D deficiency Last vitamin D Lab Results  Component Value Date   VD25OH 34.05 04/26/2022   Low, to start oral replacement   Elevated PSA Lab Results  Component Value Date   PSA 5.21 (H) 04/26/2022   PSA 4.35 (H) 12/18/2021   PSA 4.65 (H) 12/04/2020   PSA 5.28 (H) 12/04/2020   With mild worsening, pt has planned urology f/u soon  Blood in semen Benign, for UA o/w ok to follow  Tremor Recent onset mild, declines mri brain, beta blocker trial, or neurology referral, so continue to follow for now  Followup: Return if symptoms worsen or fail to improve.  Cathlean Cower, MD 04/28/2022 7:30 AM [facility]

## 2022-04-26 NOTE — Patient Instructions (Signed)
Please continue all other medications as before, and refills have been done if requested.  Please have the pharmacy call with any other refills you may need.  Please continue your efforts at being more active, low cholesterol diet, and weight control.  Please keep your appointments with your specialists as you may have planned  Please go to the LAB at the blood drawing area for the tests to be done - just the urine testing today  You will be contacted by phone if any changes need to be made immediately.  Otherwise, you will receive a letter about your results with an explanation, but please check with MyChart first.  Please remember to sign up for MyChart if you have not done so, as this will be important to you in the future with finding out test results, communicating by private email, and scheduling acute appointments online when needed.   

## 2022-04-27 LAB — TSH: TSH: 1.93 u[IU]/mL (ref 0.35–5.50)

## 2022-04-27 LAB — PSA: PSA: 5.21 ng/mL — ABNORMAL HIGH (ref 0.10–4.00)

## 2022-04-27 LAB — VITAMIN B12: Vitamin B-12: 551 pg/mL (ref 211–911)

## 2022-04-27 LAB — VITAMIN D 25 HYDROXY (VIT D DEFICIENCY, FRACTURES): VITD: 34.05 ng/mL (ref 30.00–100.00)

## 2022-04-28 ENCOUNTER — Encounter: Payer: Self-pay | Admitting: Internal Medicine

## 2022-04-28 DIAGNOSIS — R251 Tremor, unspecified: Secondary | ICD-10-CM | POA: Insufficient documentation

## 2022-04-28 NOTE — Assessment & Plan Note (Signed)
Lab Results  Component Value Date   PSA 5.21 (H) 04/26/2022   PSA 4.35 (H) 12/18/2021   PSA 4.65 (H) 12/04/2020   PSA 5.28 (H) 12/04/2020   With mild worsening, pt has planned urology f/u soon

## 2022-04-28 NOTE — Assessment & Plan Note (Signed)
Benign, for UA o/w ok to follow

## 2022-04-28 NOTE — Assessment & Plan Note (Signed)
Lab Results  Component Value Date   LDLCALC 45 04/26/2022   Stable, pt to continue current statin crestor 10 mg qd

## 2022-04-28 NOTE — Assessment & Plan Note (Signed)
Recent onset mild, declines mri brain, beta blocker trial, or neurology referral, so continue to follow for now

## 2022-04-28 NOTE — Assessment & Plan Note (Signed)
Last vitamin D Lab Results  Component Value Date   VD25OH 34.05 04/26/2022   Low, to start oral replacement

## 2022-10-23 ENCOUNTER — Ambulatory Visit: Payer: Managed Care, Other (non HMO) | Admitting: Adult Health

## 2022-12-02 ENCOUNTER — Other Ambulatory Visit: Payer: Self-pay | Admitting: Urology

## 2022-12-02 DIAGNOSIS — C61 Malignant neoplasm of prostate: Secondary | ICD-10-CM

## 2022-12-31 ENCOUNTER — Ambulatory Visit (INDEPENDENT_AMBULATORY_CARE_PROVIDER_SITE_OTHER): Payer: Managed Care, Other (non HMO) | Admitting: Internal Medicine

## 2022-12-31 ENCOUNTER — Encounter: Payer: Self-pay | Admitting: Internal Medicine

## 2022-12-31 VITALS — BP 124/78 | HR 70 | Temp 98.4°F | Ht 70.0 in | Wt 220.0 lb

## 2022-12-31 DIAGNOSIS — R972 Elevated prostate specific antigen [PSA]: Secondary | ICD-10-CM | POA: Diagnosis not present

## 2022-12-31 DIAGNOSIS — E7800 Pure hypercholesterolemia, unspecified: Secondary | ICD-10-CM

## 2022-12-31 DIAGNOSIS — Z125 Encounter for screening for malignant neoplasm of prostate: Secondary | ICD-10-CM

## 2022-12-31 DIAGNOSIS — E538 Deficiency of other specified B group vitamins: Secondary | ICD-10-CM

## 2022-12-31 DIAGNOSIS — E559 Vitamin D deficiency, unspecified: Secondary | ICD-10-CM | POA: Diagnosis not present

## 2022-12-31 DIAGNOSIS — N182 Chronic kidney disease, stage 2 (mild): Secondary | ICD-10-CM

## 2022-12-31 DIAGNOSIS — Z0001 Encounter for general adult medical examination with abnormal findings: Secondary | ICD-10-CM | POA: Diagnosis not present

## 2022-12-31 DIAGNOSIS — R739 Hyperglycemia, unspecified: Secondary | ICD-10-CM

## 2022-12-31 MED ORDER — ROSUVASTATIN CALCIUM 10 MG PO TABS: 10.0000 mg | ORAL_TABLET | Freq: Every day | ORAL | 3 refills | Status: DC

## 2022-12-31 MED ORDER — TAMSULOSIN HCL 0.4 MG PO CAPS: ORAL_CAPSULE | ORAL | 3 refills | Status: DC

## 2022-12-31 NOTE — Patient Instructions (Signed)
Please continue all other medications as before, and refills have been done if requested.  Please have the pharmacy call with any other refills you may need.  Please continue your efforts at being more active, low cholesterol diet, and weight control.  You are otherwise up to date with prevention measures today.  Please keep your appointments with your specialists as you may have planned  Please make an Appointment to return in 6 months, or sooner if needed, also with Lab Appointment for testing done 3-5 days before at the FIRST FLOOR Lab (so this is for TWO appointments - please see the scheduling desk as you leave)  

## 2022-12-31 NOTE — Progress Notes (Unsigned)
Patient ID: Tyler Murphy, male   DOB: Nov 30, 1960, 62 y.o.   MRN: [id number]         Chief Complaint:: wellness exam and low vit d, hld, elevated psa, ckd3a       HPI:  Tyler Murphy is a 62 y.o. male here for wellness exam;   declines covid booster, o/w up to date              Also for MRI prostate soon to f/u persistent elevated psa in the 4-5 range, and nocturia. Pt denies chest pain, increased sob or doe, wheezing, orthopnea, PND, increased LE swelling, palpitations, dizziness or syncope.   Pt denies polydipsia, polyuria, or new focal neuro s/s.    Pt denies fever, wt loss, night sweats, loss of appetite, or other constitutional symptoms     Wt Readings from Last 3 Encounters:  12/31/22 220 lb (99.8 kg)  04/26/22 216 lb (98 kg)  04/22/22 219 lb (99.3 kg)   BP Readings from Last 3 Encounters:  12/31/22 124/78  04/26/22 130/80  04/22/22 (!) 140/80   Immunization History  Administered Date(s) Administered   Influenza Split 11/01/2010, 11/05/2011   Influenza,inj,Quad PF,6+ Mos 11/05/2012, 11/10/2013, 11/15/2014, 11/26/2016, 12/02/2017, 12/04/2018, 12/10/2019, 12/26/2021, 11/20/2022   Influenza-Unspecified 10/29/2020   Moderna Sars-Covid-2 Vaccination 05/11/2019, 06/08/2019   Td 10/17/2008   Tdap 12/04/2018   Zoster Recombinant(Shingrix) 11/26/2016, 02/26/2017   There are no preventive care reminders to display for this patient.     Past Medical History:  Diagnosis Date   ALLERGIC RHINITIS 09/21/2007   ALLERGY 11/26/2006   BACK PAIN, LUMBAR, CHRONIC 11/26/2006   GERD 09/21/2007   HYPERCHOLESTEROLEMIA 11/26/2006   NEPHROLITHIASIS, HX OF 09/21/2007   OBESITY, MILD 11/26/2006   Past Surgical History:  Procedure Laterality Date   COLONOSCOPY     s/p lithotrypsy     TONSILLECTOMY      reports that he has never smoked. He has never used smokeless tobacco. He reports that he does not drink alcohol and does not use drugs. family history is not on file. He was adopted. Allergies   Allergen Reactions   Bee Venom Hives and Itching   Lipitor [Atorvastatin] Other (See Comments)    myalgia   Lovastatin     myalgia   Current Outpatient Medications on File Prior to Visit  Medication Sig Dispense Refill   b complex vitamins capsule Take 1 capsule by mouth daily.     finasteride (PROSCAR) 5 MG tablet Take 5 mg by mouth daily.     Multiple Vitamins-Minerals (MULTIVITAMIN GUMMIES ADULT PO) Take 2 Doses by mouth.     No current facility-administered medications on file prior to visit.        ROS:  All others reviewed and negative.  Objective        PE:  BP 124/78 (BP Location: Right Arm, Patient Position: Sitting, Cuff Size: Normal)   Pulse 70   Temp 98.4 F (36.9 C) (Oral)   Ht 5\' 10"  (1.778 m)   Wt 220 lb (99.8 kg)   SpO2 98%   BMI 31.57 kg/m                 Constitutional: Pt appears in NAD               HENT: Head: NCAT.                Right Ear: External ear normal.  Left Ear: External ear normal.                Eyes: . Pupils are equal, round, and reactive to light. Conjunctivae and EOM are normal               Nose: without d/c or deformity               Neck: Neck supple. Gross normal ROM               Cardiovascular: Normal rate and regular rhythm.                 Pulmonary/Chest: Effort normal and breath sounds without rales or wheezing.                Abd:  Soft, NT, ND, + BS, no organomegaly               Neurological: Pt is alert. At baseline orientation, motor grossly intact               Skin: Skin is warm. No rashes, no other new lesions, LE edema - none               Psychiatric: Pt behavior is normal without agitation   Micro: none  Cardiac tracings I have personally interpreted today:  none  Pertinent Radiological findings (summarize): none   Lab Results  Component Value Date   WBC 7.1 04/26/2022   HGB 15.7 04/26/2022   HCT 45.6 04/26/2022   PLT 160.0 04/26/2022   GLUCOSE 82 04/26/2022   CHOL 115 04/26/2022    TRIG 194.0 (H) 04/26/2022   HDL 31.20 (L) 04/26/2022   LDLDIRECT 150.3 10/12/2009   LDLCALC 45 04/26/2022   ALT 28 04/26/2022   AST 22 04/26/2022   NA 138 04/26/2022   K 4.0 04/26/2022   CL 104 04/26/2022   CREATININE 1.21 04/26/2022   BUN 23 04/26/2022   CO2 27 04/26/2022   TSH 1.93 04/26/2022   PSA 5.21 (H) 04/26/2022   HGBA1C 5.6 04/26/2022   Assessment/Plan:  Tyler Murphy is a 62 y.o. White or Caucasian [1] male with  has a past medical history of ALLERGIC RHINITIS (09/21/2007), ALLERGY (11/26/2006), BACK PAIN, LUMBAR, CHRONIC (11/26/2006), GERD (09/21/2007), HYPERCHOLESTEROLEMIA (11/26/2006), NEPHROLITHIASIS, HX OF (09/21/2007), and OBESITY, MILD (11/26/2006).  Encounter for well adult exam with abnormal findings Age and sex appropriate education and counseling updated with regular exercise and diet Referrals for preventative services - none needed Immunizations addressed - declines covid booster Smoking counseling  - none needed Evidence for depression or other mood disorder - none significant Most recent labs reviewed. I have personally reviewed and have noted: 1) the patient's medical and social history 2) The patient's current medications and supplements 3) The patient's height, weight, and BMI have been recorded in the chart   Vitamin D deficiency Last vitamin D Lab Results  Component Value Date   VD25OH 34.05 04/26/2022   Low, to start oral replacement 2000 u qd  HYPERCHOLESTEROLEMIA Lab Results  Component Value Date   LDLCALC 45 04/26/2022   Stable, pt to continue current statin crestor 10 qd   Elevated PSA Lab Results  Component Value Date   PSA 5.21 (H) 04/26/2022   PSA 4.35 (H) 12/18/2021   PSA 4.65 (H) 12/04/2020   PSA 5.28 (H) 12/04/2020   Foor MRI per urlogy  CKD (chronic kidney disease) stage 2, GFR 60-89 ml/min Lab Results  Component Value Date  CREATININE 1.21 04/26/2022   Stable overall, cont to avoid nephrotoxins  Followup: Return in  about 6 months (around 06/30/2023).  Oliver Barre, MD 01/01/2023 9:36 PM [facility] [facility]

## 2023-01-01 ENCOUNTER — Encounter: Payer: Self-pay | Admitting: Internal Medicine

## 2023-01-01 NOTE — Assessment & Plan Note (Signed)
Lab Results  Component Value Date   PSA 5.21 (H) 04/26/2022   PSA 4.35 (H) 12/18/2021   PSA 4.65 (H) 12/04/2020   PSA 5.28 (H) 12/04/2020   Foor MRI per urlogy

## 2023-01-01 NOTE — Assessment & Plan Note (Signed)
Last vitamin D Lab Results  Component Value Date   VD25OH 34.05 04/26/2022   Low, to start oral replacement 2000 u qd

## 2023-01-01 NOTE — Assessment & Plan Note (Signed)

## 2023-01-01 NOTE — Assessment & Plan Note (Signed)
Lab Results  Component Value Date   CREATININE 1.21 04/26/2022   Stable overall, cont to avoid nephrotoxins

## 2023-01-01 NOTE — Assessment & Plan Note (Signed)
Lab Results  Component Value Date   LDLCALC 45 04/26/2022   Stable, pt to continue current statin crestor 10 qd

## 2023-02-01 ENCOUNTER — Ambulatory Visit
Admission: RE | Admit: 2023-02-01 | Discharge: 2023-02-01 | Disposition: A | Discharge status: Home / Self Care | Payer: Managed Care, Other (non HMO) | Source: Ambulatory Visit | Attending: Urology | Admitting: Urology

## 2023-02-01 DIAGNOSIS — C61 Malignant neoplasm of prostate: Secondary | ICD-10-CM

## 2023-02-01 MED ORDER — GADOPICLENOL 0.5 MMOL/ML IV SOLN
10.0000 mL | Freq: Once | INTRAVENOUS | Status: AC | PRN
Start: 2023-02-01 — End: 2023-02-01
  Administered 2023-02-01: 10 mL via INTRAVENOUS

## 2023-06-30 ENCOUNTER — Ambulatory Visit: Payer: Managed Care, Other (non HMO) | Admitting: Internal Medicine

## 2023-07-01 ENCOUNTER — Other Ambulatory Visit (INDEPENDENT_AMBULATORY_CARE_PROVIDER_SITE_OTHER)

## 2023-07-01 DIAGNOSIS — Z125 Encounter for screening for malignant neoplasm of prostate: Secondary | ICD-10-CM

## 2023-07-01 DIAGNOSIS — E538 Deficiency of other specified B group vitamins: Secondary | ICD-10-CM | POA: Diagnosis not present

## 2023-07-01 DIAGNOSIS — E7800 Pure hypercholesterolemia, unspecified: Secondary | ICD-10-CM | POA: Diagnosis not present

## 2023-07-01 DIAGNOSIS — E559 Vitamin D deficiency, unspecified: Secondary | ICD-10-CM | POA: Diagnosis not present

## 2023-07-01 DIAGNOSIS — R739 Hyperglycemia, unspecified: Secondary | ICD-10-CM | POA: Diagnosis not present

## 2023-07-01 LAB — URINALYSIS, ROUTINE W REFLEX MICROSCOPIC
Bilirubin Urine: NEGATIVE
Hgb urine dipstick: NEGATIVE
Ketones, ur: NEGATIVE
Leukocytes,Ua: NEGATIVE
Nitrite: NEGATIVE
RBC / HPF: NONE SEEN (ref 0–?)
Specific Gravity, Urine: >=1.03 — AB (ref 1.000–1.030)
Total Protein, Urine: NEGATIVE
Urine Glucose: NEGATIVE
Urobilinogen, UA: 0.2 (ref 0.0–1.0)
pH: 6 (ref 5.0–8.0)

## 2023-07-01 LAB — BASIC METABOLIC PANEL WITH GFR
BUN: 22 mg/dL (ref 6–23)
CO2: 28 meq/L (ref 19–32)
Calcium: 9.1 mg/dL (ref 8.4–10.5)
Chloride: 104 meq/L (ref 96–112)
Creatinine, Ser: 1.1 mg/dL (ref 0.40–1.50)
GFR: 71.2 mL/min (ref >60.00)
Glucose, Bld: 87 mg/dL (ref 70–99)
Potassium: 4.1 meq/L (ref 3.5–5.1)
Sodium: 140 meq/L (ref 135–145)

## 2023-07-01 LAB — LIPID PANEL
Cholesterol: 108 mg/dL (ref 0–200)
HDL: 37.3 mg/dL — ABNORMAL LOW (ref >39.00)
LDL Cholesterol: 59 mg/dL (ref 0–99)
NonHDL: 71.11
Total CHOL/HDL Ratio: 3
Triglycerides: 59 mg/dL (ref 0.0–149.0)
VLDL: 11.8 mg/dL (ref 0.0–40.0)

## 2023-07-01 LAB — HEPATIC FUNCTION PANEL
ALT: 18 U/L (ref 0–53)
AST: 15 U/L (ref 0–37)
Albumin: 4.5 g/dL (ref 3.5–5.2)
Alkaline Phosphatase: 58 U/L (ref 39–117)
Bilirubin, Direct: 0.2 mg/dL (ref 0.0–0.3)
Total Bilirubin: 0.7 mg/dL (ref 0.2–1.2)
Total Protein: 6.5 g/dL (ref 6.0–8.3)

## 2023-07-01 LAB — VITAMIN D 25 HYDROXY (VIT D DEFICIENCY, FRACTURES): VITD: 68.48 ng/mL (ref 30.00–100.00)

## 2023-07-01 LAB — HEMOGLOBIN A1C: Hgb A1c MFr Bld: 5.6 % (ref 4.6–6.5)

## 2023-07-01 LAB — CBC WITH DIFFERENTIAL/PLATELET
Basophils Absolute: 0 10*3/uL (ref 0.0–0.1)
Basophils Relative: 0.9 % (ref 0.0–3.0)
Eosinophils Absolute: 0.1 10*3/uL (ref 0.0–0.7)
Eosinophils Relative: 1.2 % (ref 0.0–5.0)
HCT: 45.8 % (ref 39.0–52.0)
Hemoglobin: 15.4 g/dL (ref 13.0–17.0)
Lymphocytes Relative: 20.1 % (ref 12.0–46.0)
Lymphs Abs: 1.1 10*3/uL (ref 0.7–4.0)
MCHC: 33.7 g/dL (ref 30.0–36.0)
MCV: 89.5 fl (ref 78.0–100.0)
Monocytes Absolute: 0.4 10*3/uL (ref 0.1–1.0)
Monocytes Relative: 7.4 % (ref 3.0–12.0)
Neutro Abs: 3.8 10*3/uL (ref 1.4–7.7)
Neutrophils Relative %: 70.4 % (ref 43.0–77.0)
Platelets: 167 10*3/uL (ref 150.0–400.0)
RBC: 5.12 Mil/uL (ref 4.22–5.81)
RDW: 13.2 % (ref 11.5–15.5)
WBC: 5.4 10*3/uL (ref 4.0–10.5)

## 2023-07-01 LAB — PSA: PSA: 3.83 ng/mL (ref 0.10–4.00)

## 2023-07-01 LAB — VITAMIN B12: Vitamin B-12: 730 pg/mL (ref 211–911)

## 2023-07-01 LAB — TSH: TSH: 2.19 u[IU]/mL (ref 0.35–5.50)

## 2023-07-09 ENCOUNTER — Ambulatory Visit: Admitting: Internal Medicine

## 2023-07-09 ENCOUNTER — Encounter: Payer: Self-pay | Admitting: Internal Medicine

## 2023-07-09 VITALS — BP 110/68 | HR 88 | Temp 98.8°F | Ht 70.0 in | Wt 192.0 lb

## 2023-07-09 DIAGNOSIS — E7800 Pure hypercholesterolemia, unspecified: Secondary | ICD-10-CM | POA: Diagnosis not present

## 2023-07-09 DIAGNOSIS — N182 Chronic kidney disease, stage 2 (mild): Secondary | ICD-10-CM | POA: Diagnosis not present

## 2023-07-09 DIAGNOSIS — Z0000 Encounter for general adult medical examination without abnormal findings: Secondary | ICD-10-CM | POA: Diagnosis not present

## 2023-07-09 DIAGNOSIS — R739 Hyperglycemia, unspecified: Secondary | ICD-10-CM

## 2023-07-09 DIAGNOSIS — E559 Vitamin D deficiency, unspecified: Secondary | ICD-10-CM | POA: Diagnosis not present

## 2023-07-09 DIAGNOSIS — E538 Deficiency of other specified B group vitamins: Secondary | ICD-10-CM

## 2023-07-09 DIAGNOSIS — R972 Elevated prostate specific antigen [PSA]: Secondary | ICD-10-CM

## 2023-07-09 DIAGNOSIS — R9431 Abnormal electrocardiogram [ECG] [EKG]: Secondary | ICD-10-CM

## 2023-07-09 MED ORDER — ROSUVASTATIN CALCIUM 10 MG PO TABS
10.0000 mg | ORAL_TABLET | ORAL | 3 refills | Status: AC
Start: 2023-07-09 — End: ?

## 2023-07-09 NOTE — Patient Instructions (Signed)
 Ok to continue the crestor  10 mg every other day  Please continue all other medications as before, and refills have been done if requested.  Please have the pharmacy call with any other refills you may need.  Please continue your efforts at being more active, low cholesterol diet, and weight control.  You are otherwise up to date with prevention measures today.  Please keep your appointments with your specialists as you may have planned  You will be contacted regarding the referral for: Cardiac CT score  Please make an Appointment to return in 6 months, or sooner if needed, also with Lab Appointment for testing done 3-5 days before at the FIRST FLOOR Lab (so this is for TWO appointments - please see the scheduling desk as you leave)

## 2023-07-09 NOTE — Progress Notes (Signed)
 Patient ID: Tyler Murphy, male   DOB: [birth year], 63 y.o.   MRN: [id number]         Chief Complaint:: wellness exam and hld, ckd2, low vit d, elevated psa       HPI:  Tyler Murphy is a 63 y.o. male here for wellness exam; up to date                        Also Pt denies chest pain, increased sob or doe, wheezing, orthopnea, PND, increased LE swelling, palpitations, dizziness or syncope.   Pt denies polydipsia, polyuria, or new focal neuro s/s.    Pt denies fever, wt loss, night sweats, loss of appetite, or other constitutional symptoms  Willing for Card CT score  Has taken the statin and tolerated ok.  .     Wt Readings from Last 3 Encounters:  07/09/23 192 lb (87.1 kg)  12/31/22 220 lb (99.8 kg)  04/26/22 216 lb (98 kg)   BP Readings from Last 3 Encounters:  07/09/23 110/68  12/31/22 124/78  04/26/22 130/80   Immunization History  Administered Date(s) Administered   Influenza Split 11/01/2010, 11/05/2011   Influenza,inj,Quad PF,6+ Mos 11/05/2012, 11/10/2013, 11/15/2014, 11/26/2016, 12/02/2017, 12/04/2018, 12/10/2019, 12/26/2021, 11/20/2022   Influenza-Unspecified 10/29/2020   Moderna Sars-Covid-2 Vaccination 05/11/2019, 06/08/2019   Td 10/17/2008   Tdap 12/04/2018   Zoster Recombinant(Shingrix) 11/26/2016, 02/26/2017  There are no preventive care reminders to display for this patient.    Past Medical History:  Diagnosis Date   ALLERGIC RHINITIS 09/21/2007   ALLERGY 11/26/2006   BACK PAIN, LUMBAR, CHRONIC 11/26/2006   GERD 09/21/2007   HYPERCHOLESTEROLEMIA 11/26/2006   NEPHROLITHIASIS, HX OF 09/21/2007   OBESITY, MILD 11/26/2006   Past Surgical History:  Procedure Laterality Date   COLONOSCOPY     s/p lithotrypsy     TONSILLECTOMY      reports that he has never smoked. He has never used smokeless tobacco. He reports that he does not drink alcohol and does not use drugs. family history is not on file. He was adopted. Allergies  Allergen Reactions   Bee Venom Hives and  Itching   Lipitor [Atorvastatin ] Other (See Comments)    myalgia   Lovastatin      myalgia   Current Outpatient Medications on File Prior to Visit  Medication Sig Dispense Refill   b complex vitamins capsule Take 1 capsule by mouth daily.     finasteride (PROSCAR) 5 MG tablet Take 5 mg by mouth daily.     mirabegron ER (MYRBETRIQ) 25 MG TB24 tablet Take 25 mg by mouth daily.     Multiple Vitamins-Minerals (MULTIVITAMIN GUMMIES ADULT PO) Take 2 Doses by mouth.     tamsulosin  (FLOMAX ) 0.4 MG CAPS capsule TAKE 1 CAPSULE BY MOUTH DAILY 90 capsule 3   No current facility-administered medications on file prior to visit.        ROS:  All others reviewed and negative.  Objective        PE:  BP 110/68 (BP Location: Right Arm, Patient Position: Sitting, Cuff Size: Normal)   Pulse 88   Temp 98.8 F (37.1 C) (Oral)   Ht 5\' 10"  (1.778 m)   Wt 192 lb (87.1 kg)   SpO2 98%   BMI 27.55 kg/m                 Constitutional: Pt appears in NAD  HENT: Head: NCAT.                Right Ear: External ear normal.                 Left Ear: External ear normal.                Eyes: . Pupils are equal, round, and reactive to light. Conjunctivae and EOM are normal               Nose: without d/c or deformity               Neck: Neck supple. Gross normal ROM               Cardiovascular: Normal rate and regular rhythm.                 Pulmonary/Chest: Effort normal and breath sounds without rales or wheezing.                Abd:  Soft, NT, ND, + BS, no organomegaly               Neurological: Pt is alert. At baseline orientation, motor grossly intact               Skin: Skin is warm. No rashes, no other new lesions, LE edema - none               Psychiatric: Pt behavior is normal without agitation   Micro: none  Cardiac tracings I have personally interpreted today:  none  Pertinent Radiological findings (summarize): none   Lab Results  Component Value Date   WBC 5.4 07/01/2023   HGB  15.4 07/01/2023   HCT 45.8 07/01/2023   PLT 167.0 07/01/2023   GLUCOSE 87 07/01/2023   CHOL 108 07/01/2023   TRIG 59.0 07/01/2023   HDL 37.30 (L) 07/01/2023   LDLDIRECT 150.3 10/12/2009   LDLCALC 59 07/01/2023   ALT 18 07/01/2023   AST 15 07/01/2023   NA 140 07/01/2023   K 4.1 07/01/2023   CL 104 07/01/2023   CREATININE 1.10 07/01/2023   BUN 22 07/01/2023   CO2 28 07/01/2023   TSH 2.19 07/01/2023   PSA 3.83 07/01/2023   HGBA1C 5.6 07/01/2023   Assessment/Plan:  Tyler Murphy is a 63 y.o. White or Caucasian [1] male with  has a past medical history of ALLERGIC RHINITIS (09/21/2007), ALLERGY (11/26/2006), BACK PAIN, LUMBAR, CHRONIC (11/26/2006), GERD (09/21/2007), HYPERCHOLESTEROLEMIA (11/26/2006), NEPHROLITHIASIS, HX OF (09/21/2007), and OBESITY, MILD (11/26/2006).  Preventative health care Age and sex appropriate education and counseling updated with regular exercise and diet Referrals for preventative services - none needed Immunizations addressed - none needed Smoking counseling  - none needed Evidence for depression or other mood disorder - none significant Most recent labs reviewed. I have personally reviewed and have noted: 1) the patient's medical and social history 2) The patient's current medications and supplements 3) The patient's height, weight, and BMI have been recorded in the chart   HYPERCHOLESTEROLEMIA Lab Results  Component Value Date   LDLCALC 59 07/01/2023   Stable, pt to continue current statin crestor  10 mg every day, also for Card CT score   CKD (chronic kidney disease) stage 2, GFR 60-89 ml/min Lab Results  Component Value Date   CREATININE 1.10 07/01/2023   Stable overall, cont to avoid nephrotoxins   Vitamin D  deficiency Last vitamin D  Lab Results  Component Value Date   VD25OH 68.48  07/01/2023   Stable, cont oral replacement   Elevated PSA Lab Results  Component Value Date   PSA 3.83 07/01/2023   PSA 5.21 (H) 04/26/2022   PSA 4.35  (H) 12/18/2021   Improved, cont to f/u urology as planned  Followup: Return in about 6 months (around 01/09/2024).  Rosalia Colonel, MD 07/10/2023 7:53 PM [facility]

## 2023-07-10 ENCOUNTER — Encounter: Payer: Self-pay | Admitting: Internal Medicine

## 2023-07-10 NOTE — Assessment & Plan Note (Signed)

## 2023-07-10 NOTE — Assessment & Plan Note (Signed)
 Lab Results  Component Value Date   PSA 3.83 07/01/2023   PSA 5.21 (H) 04/26/2022   PSA 4.35 (H) 12/18/2021   Improved, cont to f/u urology as planned

## 2023-07-10 NOTE — Assessment & Plan Note (Signed)
 Last vitamin D  Lab Results  Component Value Date   VD25OH 68.48 07/01/2023   Stable, cont oral replacement

## 2023-07-10 NOTE — Assessment & Plan Note (Addendum)
 Lab Results  Component Value Date   LDLCALC 59 07/01/2023   Stable, pt to continue current statin crestor  10 mg every day, also for Card CT score

## 2023-07-10 NOTE — Assessment & Plan Note (Signed)
 Lab Results  Component Value Date   CREATININE 1.10 07/01/2023   Stable overall, cont to avoid nephrotoxins

## 2023-08-06 ENCOUNTER — Other Ambulatory Visit: Payer: Self-pay | Admitting: Internal Medicine

## 2023-08-06 ENCOUNTER — Ambulatory Visit (HOSPITAL_BASED_OUTPATIENT_CLINIC_OR_DEPARTMENT_OTHER)
Admission: RE | Admit: 2023-08-06 | Discharge: 2023-08-06 | Disposition: A | Discharge status: Home / Self Care | Payer: Self-pay | Source: Ambulatory Visit | Attending: Internal Medicine | Admitting: Internal Medicine

## 2023-08-06 ENCOUNTER — Ambulatory Visit: Payer: Self-pay | Admitting: Internal Medicine

## 2023-08-06 DIAGNOSIS — E7800 Pure hypercholesterolemia, unspecified: Secondary | ICD-10-CM | POA: Insufficient documentation

## 2023-08-06 DIAGNOSIS — R9431 Abnormal electrocardiogram [ECG] [EKG]: Secondary | ICD-10-CM | POA: Insufficient documentation

## 2023-08-06 DIAGNOSIS — R739 Hyperglycemia, unspecified: Secondary | ICD-10-CM | POA: Insufficient documentation

## 2023-08-06 MED ORDER — ASPIRIN 81 MG PO TBEC: 81.0000 mg | DELAYED_RELEASE_TABLET | Freq: Every day | ORAL | Status: AC

## 2023-09-03 DIAGNOSIS — M170 Bilateral primary osteoarthritis of knee: Secondary | ICD-10-CM | POA: Insufficient documentation

## 2023-09-09 DIAGNOSIS — M1712 Unilateral primary osteoarthritis, left knee: Secondary | ICD-10-CM | POA: Insufficient documentation

## 2024-01-13 ENCOUNTER — Ambulatory Visit: Admitting: Internal Medicine

## 2024-01-23 ENCOUNTER — Ambulatory Visit: Admitting: Internal Medicine

## 2024-01-23 ENCOUNTER — Encounter: Payer: Self-pay | Admitting: Internal Medicine

## 2024-01-23 VITALS — BP 126/80 | HR 60 | Temp 98.3°F | Ht 70.0 in | Wt 186.0 lb

## 2024-01-23 DIAGNOSIS — R739 Hyperglycemia, unspecified: Secondary | ICD-10-CM

## 2024-01-23 DIAGNOSIS — E559 Vitamin D deficiency, unspecified: Secondary | ICD-10-CM

## 2024-01-23 DIAGNOSIS — N182 Chronic kidney disease, stage 2 (mild): Secondary | ICD-10-CM

## 2024-01-23 DIAGNOSIS — E7800 Pure hypercholesterolemia, unspecified: Secondary | ICD-10-CM

## 2024-01-23 DIAGNOSIS — R972 Elevated prostate specific antigen [PSA]: Secondary | ICD-10-CM

## 2024-01-23 DIAGNOSIS — Z23 Encounter for immunization: Secondary | ICD-10-CM

## 2024-01-23 NOTE — Assessment & Plan Note (Signed)
 Lab Results  Component Value Date   LDLCALC 59 07/01/2023   Stable, pt to continue current statin crestor  10 mg qd

## 2024-01-23 NOTE — Assessment & Plan Note (Signed)
 Lab Results  Component Value Date   CREATININE 1.10 07/01/2023   Stable overall, cont to avoid nephrotoxins CKD2

## 2024-01-23 NOTE — Assessment & Plan Note (Signed)
 Asymptomatic, overall improved  Lab Results  Component Value Date   PSA 3.83 07/01/2023   PSA 5.21 (H) 04/26/2022   PSA 4.35 (H) 12/18/2021   Pt to continue to follow with urology as well

## 2024-01-23 NOTE — Progress Notes (Signed)
 Patient ID: Tyler Murphy, male   DOB: 1960-12-15, 63 y.o.   MRN: [id number]        Chief Complaint: follow up HTN, HLD, ckd 2, elev psa, low vit d       HPI:  Tyler Murphy is a 63 y.o. male here overall doing ok.  Pt denies chest pain, increased sob or doe, wheezing, orthopnea, PND, increased LE swelling, palpitations, dizziness or syncope.  Did have recent Card Ct score 39 which is 43rd percentile.   Pt denies polydipsia, polyuria, or new focal neuro s/s.    Pt denies fever, wt loss, night sweats, loss of appetite, or other constitutional symptoms  Due for prevnar 20       Wt Readings from Last 3 Encounters:  01/23/24 186 lb (84.4 kg)  07/09/23 192 lb (87.1 kg)  12/31/22 220 lb (99.8 kg)   BP Readings from Last 3 Encounters:  01/23/24 126/80  07/09/23 110/68  12/31/22 124/78         Past Medical History:  Diagnosis Date   ALLERGIC RHINITIS 09/21/2007   ALLERGY 11/26/2006   BACK PAIN, LUMBAR, CHRONIC 11/26/2006   GERD 09/21/2007   HYPERCHOLESTEROLEMIA 11/26/2006   NEPHROLITHIASIS, HX OF 09/21/2007   OBESITY, MILD 11/26/2006   Past Surgical History:  Procedure Laterality Date   COLONOSCOPY     s/p lithotrypsy     TONSILLECTOMY      reports that he has never smoked. He has never used smokeless tobacco. He reports that he does not drink alcohol and does not use drugs. family history is not on file. He was adopted. Allergies  Allergen Reactions   Bee Venom Hives and Itching   Lipitor [Atorvastatin ] Other (See Comments)    myalgia   Lovastatin      myalgia   Current Outpatient Medications on File Prior to Visit  Medication Sig Dispense Refill   aspirin  EC 81 MG tablet Take 1 tablet (81 mg total) by mouth daily. Swallow whole.     b complex vitamins capsule Take 1 capsule by mouth daily.     finasteride (PROSCAR) 5 MG tablet Take 5 mg by mouth daily.     mirabegron ER (MYRBETRIQ) 25 MG TB24 tablet Take 25 mg by mouth daily.     Multiple Vitamins-Minerals (MULTIVITAMIN GUMMIES  ADULT PO) Take 2 Doses by mouth.     rosuvastatin  (CRESTOR ) 10 MG tablet Take 1 tablet (10 mg total) by mouth every other day. 45 tablet 3   tamsulosin  (FLOMAX ) 0.4 MG CAPS capsule TAKE 1 CAPSULE BY MOUTH DAILY 90 capsule 3   No current facility-administered medications on file prior to visit.        ROS:  All others reviewed and negative.  Objective        PE:  BP 126/80 (BP Location: Right Arm, Patient Position: Sitting, Cuff Size: Normal)   Pulse 60   Temp 98.3 F (36.8 C) (Oral)   Ht 5' 10 (1.778 m)   Wt 186 lb (84.4 kg)   SpO2 100%   BMI 26.69 kg/m                 Constitutional: Pt appears in NAD               HENT: Head: NCAT.                Right Ear: External ear normal.  Left Ear: External ear normal.                Eyes: . Pupils are equal, round, and reactive to light. Conjunctivae and EOM are normal               Nose: without d/c or deformity               Neck: Neck supple. Gross normal ROM               Cardiovascular: Normal rate and regular rhythm.                 Pulmonary/Chest: Effort normal and breath sounds without rales or wheezing.                Abd:  Soft, NT, ND, + BS, no organomegaly               Neurological: Pt is alert. At baseline orientation, motor grossly intact               Skin: Skin is warm. No rashes, no other new lesions, LE edema - none               Psychiatric: Pt behavior is normal without agitation   Micro: none  Cardiac tracings I have personally interpreted today:  none  Pertinent Radiological findings (summarize): none   Lab Results  Component Value Date   WBC 5.4 07/01/2023   HGB 15.4 07/01/2023   HCT 45.8 07/01/2023   PLT 167.0 07/01/2023   GLUCOSE 87 07/01/2023   CHOL 108 07/01/2023   TRIG 59.0 07/01/2023   HDL 37.30 (L) 07/01/2023   LDLDIRECT 150.3 10/12/2009   LDLCALC 59 07/01/2023   ALT 18 07/01/2023   AST 15 07/01/2023   NA 140 07/01/2023   K 4.1 07/01/2023   CL 104 07/01/2023    CREATININE 1.10 07/01/2023   BUN 22 07/01/2023   CO2 28 07/01/2023   TSH 2.19 07/01/2023   PSA 3.83 07/01/2023   HGBA1C 5.6 07/01/2023   Assessment/Plan:  Tyler Murphy is a 63 y.o. White or Caucasian [1] male with  has a past medical history of ALLERGIC RHINITIS (09/21/2007), ALLERGY (11/26/2006), BACK PAIN, LUMBAR, CHRONIC (11/26/2006), GERD (09/21/2007), HYPERCHOLESTEROLEMIA (11/26/2006), NEPHROLITHIASIS, HX OF (09/21/2007), and OBESITY, MILD (11/26/2006).  Vitamin D  deficiency Last vitamin D  Lab Results  Component Value Date   VD25OH 68.48 07/01/2023   Stable, cont oral replacement   HYPERCHOLESTEROLEMIA Lab Results  Component Value Date   LDLCALC 59 07/01/2023   Stable, pt to continue current statin crestor  10 mg qd   Elevated PSA Asymptomatic, overall improved  Lab Results  Component Value Date   PSA 3.83 07/01/2023   PSA 5.21 (H) 04/26/2022   PSA 4.35 (H) 12/18/2021   Pt to continue to follow with urology as well  CKD (chronic kidney disease) stage 2, GFR 60-89 ml/min Lab Results  Component Value Date   CREATININE 1.10 07/01/2023   Stable overall, cont to avoid nephrotoxins CKD2  Followup: Return in about 6 months (around 07/23/2024).  Tyler Rush, MD 01/23/2024 9:30 AM [facility] [facility]

## 2024-01-23 NOTE — Assessment & Plan Note (Signed)
 Last vitamin D  Lab Results  Component Value Date   VD25OH 68.48 07/01/2023   Stable, cont oral replacement

## 2024-01-23 NOTE — Patient Instructions (Signed)
 You had the Prevnar 20 pneumonia shot today  Please continue all other medications as before, and refills have been done if requested.  Please have the pharmacy call with any other refills you may need.  Please continue your efforts at being more active, low cholesterol diet, and weight control.  Please keep your appointments with your specialists as you may have planned  Please make an Appointment to return in 6 months, or sooner if needed, also with Lab Appointment for testing done 3-5 days before at the FIRST FLOOR Lab (so this is for TWO appointments - please see the scheduling desk as you leave)

## 2024-01-26 ENCOUNTER — Other Ambulatory Visit: Payer: Self-pay | Admitting: Internal Medicine

## 2024-01-26 ENCOUNTER — Other Ambulatory Visit: Payer: Self-pay
